# Patient Record
Sex: Female | Born: 1980 | Race: White | Hispanic: No | Marital: Married | Smoking: Never smoker
Health system: Southern US, Community
[De-identification: ages and names within clinical notes are randomized; demographics above are authoritative.]

## PROBLEM LIST (undated history)

## (undated) ENCOUNTER — Inpatient Hospital Stay (HOSPITAL_COMMUNITY): Payer: Self-pay

## (undated) DIAGNOSIS — Z789 Other specified health status: Secondary | ICD-10-CM

## (undated) HISTORY — PX: NO PAST SURGERIES: SHX2092

---
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
## 2000-10-11 ENCOUNTER — Other Ambulatory Visit: Admission: RE | Admit: 2000-10-11 | Discharge: 2000-10-11 | Payer: Self-pay | Admitting: Obstetrics and Gynecology

## 2000-11-08 ENCOUNTER — Other Ambulatory Visit: Admission: RE | Admit: 2000-11-08 | Discharge: 2000-11-08 | Payer: Self-pay | Admitting: Obstetrics and Gynecology

## 2000-11-08 ENCOUNTER — Encounter (INDEPENDENT_AMBULATORY_CARE_PROVIDER_SITE_OTHER): Payer: Self-pay | Admitting: Specialist

## 2001-04-20 ENCOUNTER — Other Ambulatory Visit: Admission: RE | Admit: 2001-04-20 | Discharge: 2001-04-20 | Payer: Self-pay | Admitting: Obstetrics and Gynecology

## 2001-11-13 ENCOUNTER — Other Ambulatory Visit: Admission: RE | Admit: 2001-11-13 | Discharge: 2001-11-13 | Payer: Self-pay | Admitting: Obstetrics and Gynecology

## 2002-04-29 ENCOUNTER — Other Ambulatory Visit: Admission: RE | Admit: 2002-04-29 | Discharge: 2002-04-29 | Payer: Self-pay | Admitting: Obstetrics and Gynecology

## 2002-12-20 ENCOUNTER — Other Ambulatory Visit: Admission: RE | Admit: 2002-12-20 | Discharge: 2002-12-20 | Payer: Self-pay | Admitting: Obstetrics and Gynecology

## 2003-06-25 ENCOUNTER — Other Ambulatory Visit: Admission: RE | Admit: 2003-06-25 | Discharge: 2003-06-25 | Payer: Self-pay | Admitting: Obstetrics and Gynecology

## 2003-12-25 ENCOUNTER — Other Ambulatory Visit: Admission: RE | Admit: 2003-12-25 | Discharge: 2003-12-25 | Payer: Self-pay | Admitting: Obstetrics and Gynecology

## 2004-06-07 ENCOUNTER — Other Ambulatory Visit: Admission: RE | Admit: 2004-06-07 | Discharge: 2004-06-07 | Payer: Self-pay | Admitting: Obstetrics and Gynecology

## 2004-12-27 ENCOUNTER — Other Ambulatory Visit: Admission: RE | Admit: 2004-12-27 | Discharge: 2004-12-27 | Payer: Self-pay | Admitting: Obstetrics and Gynecology

## 2005-12-14 ENCOUNTER — Ambulatory Visit: Payer: Self-pay | Admitting: Pulmonary Disease

## 2005-12-26 ENCOUNTER — Ambulatory Visit: Payer: Self-pay

## 2005-12-26 ENCOUNTER — Encounter: Payer: Self-pay | Admitting: Cardiology

## 2007-09-28 ENCOUNTER — Telehealth (INDEPENDENT_AMBULATORY_CARE_PROVIDER_SITE_OTHER): Payer: Self-pay | Admitting: *Deleted

## 2009-04-28 ENCOUNTER — Telehealth: Payer: Self-pay | Admitting: Pulmonary Disease

## 2014-04-29 LAB — OB RESULTS CONSOLE GC/CHLAMYDIA
Chlamydia: NEGATIVE
Gonorrhea: NEGATIVE

## 2014-05-02 NOTE — L&D Delivery Note (Signed)
Delivery Note At 3:31 AM a viable and healthy female was delivered via Vaginal, Spontaneous Delivery (Presentation: Middle Occiput Anterior).  APGAR: 9, 9; weight  pending.   Placenta status: Intact, Spontaneous.  Cord: 3 vessels   Anesthesia: None, local for repair Episiotomy: None Lacerations: 2nd degree Suture Repair: 3.0 vicryl Est. Blood Loss (mL):  150 cc  Mom to postpartum.  Baby to Couplet care / Skin to Skin.  Heidie Krall H. 11/28/2014, 3:56 AM

## 2014-05-28 LAB — OB RESULTS CONSOLE HEPATITIS B SURFACE ANTIGEN: Hepatitis B Surface Ag: NEGATIVE

## 2014-05-28 LAB — OB RESULTS CONSOLE RPR: RPR: NONREACTIVE

## 2014-05-28 LAB — OB RESULTS CONSOLE HIV ANTIBODY (ROUTINE TESTING): HIV: NONREACTIVE

## 2014-05-28 LAB — OB RESULTS CONSOLE RUBELLA ANTIBODY, IGM: RUBELLA: UNDETERMINED

## 2014-05-30 LAB — OB RESULTS CONSOLE ANTIBODY SCREEN: Antibody Screen: NEGATIVE

## 2014-05-30 LAB — OB RESULTS CONSOLE ABO/RH: RH Type: POSITIVE

## 2014-07-16 ENCOUNTER — Encounter (HOSPITAL_COMMUNITY): Payer: Self-pay | Admitting: *Deleted

## 2014-07-16 ENCOUNTER — Inpatient Hospital Stay (HOSPITAL_COMMUNITY)
Admission: AD | Admit: 2014-07-16 | Discharge: 2014-07-16 | Disposition: A | Discharge status: Home / Self Care | Payer: BLUE CROSS/BLUE SHIELD | Source: Ambulatory Visit | Attending: Obstetrics and Gynecology | Admitting: Obstetrics and Gynecology

## 2014-07-16 DIAGNOSIS — Z3A20 20 weeks gestation of pregnancy: Secondary | ICD-10-CM | POA: Diagnosis not present

## 2014-07-16 DIAGNOSIS — R51 Headache: Secondary | ICD-10-CM

## 2014-07-16 DIAGNOSIS — R030 Elevated blood-pressure reading, without diagnosis of hypertension: Secondary | ICD-10-CM | POA: Insufficient documentation

## 2014-07-16 DIAGNOSIS — O26892 Other specified pregnancy related conditions, second trimester: Secondary | ICD-10-CM | POA: Diagnosis not present

## 2014-07-16 DIAGNOSIS — O9989 Other specified diseases and conditions complicating pregnancy, childbirth and the puerperium: Secondary | ICD-10-CM | POA: Diagnosis not present

## 2014-07-16 DIAGNOSIS — R519 Headache, unspecified: Secondary | ICD-10-CM

## 2014-07-16 HISTORY — DX: Other specified health status: Z78.9

## 2014-07-16 LAB — URINALYSIS, ROUTINE W REFLEX MICROSCOPIC
Bilirubin Urine: NEGATIVE
GLUCOSE, UA: NEGATIVE mg/dL
HGB URINE DIPSTICK: NEGATIVE
Ketones, ur: NEGATIVE mg/dL
LEUKOCYTES UA: NEGATIVE
Nitrite: NEGATIVE
Protein, ur: NEGATIVE mg/dL
SPECIFIC GRAVITY, URINE: 1.01 (ref 1.005–1.030)
UROBILINOGEN UA: 0.2 mg/dL (ref 0.0–1.0)
pH: 6.5 (ref 5.0–8.0)

## 2014-07-16 NOTE — MAU Note (Signed)
Pt presents to MAU with complaints of feeling tingling in her hands, arms, and feet and feeling hot and flushed. States she checked her blood pressure at [facility] and it was elevated. Denies any vaginal bleeding.

## 2014-07-16 NOTE — MAU Provider Note (Signed)
History     CSN: [id number]  Arrival date and time: 07/16/14 1635   First Provider Initiated Contact with Patient 07/16/14 1722      Chief Complaint  Patient presents with  . Hot Flashes  . Hypertension   HPI    [name] G1P0 at [redacted]w[redacted]d who presents elevated BP readings; she checked her BP at [facility] and it was 129/85; She called her Dr Isidore Moos and they told her to come in. She does not have a history of hypertension.  She has been feeling her baby move. She denies leaking of fluid or vaginal bleeding    She has gained 5 lbs in the last week and is concerned about this .  She has noticed increased swelling in her legs and feet with tingling in her hands and feet.  OB History    Gravida Para Term Preterm AB TAB SAB Ectopic Multiple Living   1               Past Medical History  Diagnosis Date  . Medical history non-contributory     Past Surgical History  Procedure Laterality Date  . No past surgeries      History reviewed. No pertinent family history.  History  Substance Use Topics  . Smoking status: Never Smoker   . Smokeless tobacco: Not on file  . Alcohol Use: No    Allergies:  Allergies  Allergen Reactions  . Latex Swelling    Prescriptions prior to admission  Medication Sig Dispense Refill Last Dose  . Prenatal Vit-Fe Fumarate-FA (PRENATAL MULTIVITAMIN) TABS tablet Take 1 tablet by mouth daily at 12 noon.   07/15/2014 at Unknown time  . ranitidine (ZANTAC) 75 MG tablet Take 75 mg by mouth daily as needed for heartburn.   Past Week at Unknown time   Results for orders placed or performed during the hospital encounter of 07/16/14 (from the past 48 hour(s))  Urinalysis, Routine w reflex microscopic     Status: None   Collection Time: 07/16/14  4:49 PM  Result Value Ref Range   Color, Urine YELLOW YELLOW   APPearance CLEAR CLEAR   Specific Gravity, Urine 1.010 1.005 - 1.030   pH 6.5 5.0 - 8.0   Glucose, UA NEGATIVE NEGATIVE mg/dL   Hgb urine  dipstick NEGATIVE NEGATIVE   Bilirubin Urine NEGATIVE NEGATIVE   Ketones, ur NEGATIVE NEGATIVE mg/dL   Protein, ur NEGATIVE NEGATIVE mg/dL   Urobilinogen, UA 0.2 0.0 - 1.0 mg/dL   Nitrite NEGATIVE NEGATIVE   Leukocytes, UA NEGATIVE NEGATIVE    Comment: MICROSCOPIC NOT DONE ON URINES WITH NEGATIVE PROTEIN, BLOOD, LEUKOCYTES, NITRITE, OR GLUCOSE <1000 mg/dL.    Review of Systems  Constitutional: Negative for fever and chills.  Eyes: Negative for blurred vision.  Cardiovascular: Positive for leg swelling. Negative for chest pain.  Gastrointestinal: Negative for abdominal pain.  Neurological: Negative for dizziness and headaches.   Physical Exam   Blood pressure 122/69, pulse 82, temperature 98.2 F (36.8 C), resp. rate 16, height  (1.702 m), weight 73.936 kg (163 lb), last menstrual period 02/26/2014.  Physical Exam  Constitutional: She is oriented to person, place, and time. Vital signs are normal. She appears well-developed and well-nourished.  Non-toxic appearance. She does not have a sickly appearance. She does not appear ill. No distress.  HENT:  Head: Normocephalic.  Eyes: Pupils are equal, round, and reactive to light.  Neck: Neck supple.  Cardiovascular: Normal rate and normal heart sounds.  Respiratory: Effort normal and breath sounds normal. No respiratory distress.  GI: Soft. She exhibits no distension. There is no tenderness.  Musculoskeletal: Normal range of motion.       Right ankle: She exhibits swelling (non-pitting ).       Left ankle: She exhibits swelling (non-pitting ).  Neurological: She is alert and oriented to person, place, and time. She has normal reflexes.  Skin: Skin is warm. She is not diaphoretic.  Psychiatric: Her behavior is normal.    MAU Course  Procedures  None  MDM:    UA sent  BP: 138/85 BP: 128/84  1730: discussed patient with Dr. Claiborne Billingsallahan; awaiting urine results.  Protein negative   Assessment and Plan   A:  1. Headache  in pregnancy, second trimester     P:  Discharge home in stable condition Follow up in the office on Friday for a BP check Return to MAU if symptoms worsen Increase activity at work Support hose recommended Preeclampsia precautions     Duane LopeJennifer I Rasch, NP  07/16/2014 5:33 PM

## 2014-10-30 LAB — OB RESULTS CONSOLE GBS: STREP GROUP B AG: POSITIVE

## 2014-11-05 ENCOUNTER — Inpatient Hospital Stay (HOSPITAL_COMMUNITY)
Admission: AD | Admit: 2014-11-05 | Discharge: 2014-11-05 | Disposition: A | Discharge status: Home / Self Care | Payer: BLUE CROSS/BLUE SHIELD | Source: Ambulatory Visit | Attending: Obstetrics | Admitting: Obstetrics

## 2014-11-05 ENCOUNTER — Encounter (HOSPITAL_COMMUNITY): Payer: Self-pay

## 2014-11-05 ENCOUNTER — Inpatient Hospital Stay (HOSPITAL_COMMUNITY): Payer: BLUE CROSS/BLUE SHIELD

## 2014-11-05 DIAGNOSIS — Z3A36 36 weeks gestation of pregnancy: Secondary | ICD-10-CM | POA: Insufficient documentation

## 2014-11-05 DIAGNOSIS — O368130 Decreased fetal movements, third trimester, not applicable or unspecified: Secondary | ICD-10-CM | POA: Diagnosis not present

## 2014-11-05 DIAGNOSIS — O288 Other abnormal findings on antenatal screening of mother: Secondary | ICD-10-CM | POA: Insufficient documentation

## 2014-11-05 NOTE — MAU Provider Note (Signed)
  History     CSN: [id number]  Arrival date and time: 11/05/14 1251   First Provider Initiated Contact with Patient 11/05/14 1439      Chief Complaint  Patient presents with  . Decreased Fetal Movement   HPIpt is 2865w0d G1P0 sent from office with nonreactive NST and decreased FM Pt is having some contractions- Pt denies vaginal bleeding or LOF  Rn note:   MAU Note 11/05/2014 1:13 PM    Expand All Collapse All   Sent from office, non-reactive NST at office. Sent over for US (their tech was not in office) and monitoring         Past Medical History  Diagnosis Date  . Medical history non-contributory     Past Surgical History  Procedure Laterality Date  . No past surgeries      History reviewed. No pertinent family history.  History  Substance Use Topics  . Smoking status: Never Smoker   . Smokeless tobacco: Not on file  . Alcohol Use: No    Allergies:  Allergies  Allergen Reactions  . Latex Swelling    Prescriptions prior to admission  Medication Sig Dispense Refill Last Dose  . Prenatal Vit-Fe Fumarate-FA (PRENATAL MULTIVITAMIN) TABS tablet Take 1 tablet by mouth daily at 12 noon.   11/04/2014 at Unknown time  . ranitidine (ZANTAC) 75 MG tablet Take 75 mg by mouth daily as needed for heartburn.   11/05/2014 at Unknown time    Review of Systems  Constitutional: Negative for fever and chills.  Gastrointestinal: Positive for abdominal pain. Negative for nausea and vomiting.  Genitourinary: Negative for dysuria and urgency.   Physical Exam   Blood pressure 132/81, pulse 97, temperature 98.4 F (36.9 C), temperature source Oral, resp. rate 18, height 5\' 6"  (1.676 m), weight 179 lb (81.194 kg), last menstrual period 02/26/2014.  Physical Exam  Nursing note and vitals reviewed. Constitutional: She is oriented to person, place, and time. She appears well-developed and well-nourished. No distress.  HENT:  Head: Normocephalic.  Eyes: Pupils are equal, round, and  reactive to light.  Neck: Normal range of motion. Neck supple.  Cardiovascular: Normal rate.   Respiratory: Effort normal.  GI: Soft. She exhibits no distension. There is no tenderness. There is no rebound and no guarding.  Ctx every 2 to 3 min- mild; NST reactive baseline 125bpm  Musculoskeletal: Normal range of motion.  Neurological: She is alert and oriented to person, place, and time.  Skin: Skin is warm and dry.  Psychiatric: She has a normal mood and affect.    MAU Course  Procedures [place] Fetal Bpp W/o Non Stress  11/05/2014   OBSTETRICAL ULTRASOUND: This exam was performed within a [facility]. The OB US report was generated in the [facility] system, and faxed to the ordering physician.   This report is available in the [facility] [employer] PACS. See the [facility] Obstetric US report via the Image Link.  BPP 6/8 (-2 for no BM) Discussed with Dr. Chestine Sporelark- will have pt f/u in office tomorrow for repeat BPP Assessment and Plan  Decreased FM - kick counts form given F/u in office tomorrow  Christina Tran 11/05/2014, 2:43 PM

## 2014-11-05 NOTE — MAU Note (Signed)
Per Dr Chestine Sporelark pt to have BPP, if NST is reactive before BPP, the BPP is not to be complete.

## 2014-11-05 NOTE — MAU Note (Signed)
Sent from office, non-reactive NST at office.  Sent over for US (their tech was not in office) and monitoring

## 2014-11-27 ENCOUNTER — Encounter (HOSPITAL_COMMUNITY): Payer: Self-pay

## 2014-11-27 ENCOUNTER — Inpatient Hospital Stay (HOSPITAL_COMMUNITY)
Admission: AD | Admit: 2014-11-27 | Discharge: 2014-11-29 | DRG: 775 | Disposition: A | Discharge status: Home / Self Care | Payer: BLUE CROSS/BLUE SHIELD | Source: Ambulatory Visit | Attending: Obstetrics and Gynecology | Admitting: Obstetrics and Gynecology

## 2014-11-27 DIAGNOSIS — O99824 Streptococcus B carrier state complicating childbirth: Secondary | ICD-10-CM | POA: Diagnosis present

## 2014-11-27 DIAGNOSIS — Z3A39 39 weeks gestation of pregnancy: Secondary | ICD-10-CM | POA: Diagnosis present

## 2014-11-27 DIAGNOSIS — O4290 Premature rupture of membranes, unspecified as to length of time between rupture and onset of labor, unspecified weeks of gestation: Secondary | ICD-10-CM | POA: Diagnosis present

## 2014-11-27 LAB — CBC
HCT: 39.4 % (ref 36.0–46.0)
HEMOGLOBIN: 13.8 g/dL (ref 12.0–15.0)
MCH: 31.2 pg (ref 26.0–34.0)
MCHC: 35 g/dL (ref 30.0–36.0)
MCV: 88.9 fL (ref 78.0–100.0)
PLATELETS: 174 10*3/uL (ref 150–400)
RBC: 4.43 MIL/uL (ref 3.87–5.11)
RDW: 12.6 % (ref 11.5–15.5)
WBC: 17.5 10*3/uL — ABNORMAL HIGH (ref 4.0–10.5)

## 2014-11-27 LAB — TYPE AND SCREEN
ABO/RH(D): O POS
Antibody Screen: NEGATIVE

## 2014-11-27 LAB — POCT FERN TEST: POCT FERN TEST: POSITIVE

## 2014-11-27 MED ORDER — OXYTOCIN 40 UNITS IN LACTATED RINGERS INFUSION - SIMPLE MED
62.5000 mL/h | INTRAVENOUS | Status: DC
  Filled 2014-11-27: qty 1000

## 2014-11-27 MED ORDER — ZOLPIDEM TARTRATE 5 MG PO TABS: 5.0000 mg | ORAL_TABLET | Freq: Every evening | ORAL | Status: DC | PRN

## 2014-11-27 MED ORDER — LACTATED RINGERS IV SOLN
500.0000 mL | INTRAVENOUS | Status: DC | PRN
  Administered 2014-11-27 – 2014-11-28 (×2): 500 mL via INTRAVENOUS

## 2014-11-27 MED ORDER — CITRIC ACID-SODIUM CITRATE 334-500 MG/5ML PO SOLN
30.0000 mL | ORAL | Status: DC | PRN
  Administered 2014-11-27: 30 mL via ORAL
  Filled 2014-11-27: qty 15

## 2014-11-27 MED ORDER — OXYTOCIN BOLUS FROM INFUSION: 500.0000 mL | INTRAVENOUS | Status: DC

## 2014-11-27 MED ORDER — ONDANSETRON HCL 4 MG/2ML IJ SOLN
4.0000 mg | Freq: Four times a day (QID) | INTRAMUSCULAR | Status: DC | PRN
  Administered 2014-11-28: 4 mg via INTRAVENOUS
  Filled 2014-11-27: qty 2

## 2014-11-27 MED ORDER — PENICILLIN G POTASSIUM 5000000 UNITS IJ SOLR
5.0000 10*6.[IU] | Freq: Once | INTRAVENOUS | Status: AC
  Administered 2014-11-27: 5 10*6.[IU] via INTRAVENOUS
  Filled 2014-11-27: qty 5

## 2014-11-27 MED ORDER — LACTATED RINGERS IV SOLN: INTRAVENOUS | Status: DC

## 2014-11-27 MED ORDER — ACETAMINOPHEN 325 MG PO TABS: 650.0000 mg | ORAL_TABLET | ORAL | Status: DC | PRN

## 2014-11-27 MED ORDER — LIDOCAINE HCL (PF) 1 % IJ SOLN
30.0000 mL | INTRAMUSCULAR | Status: DC | PRN
  Administered 2014-11-28: 30 mL via SUBCUTANEOUS
  Filled 2014-11-27: qty 30

## 2014-11-27 MED ORDER — OXYCODONE-ACETAMINOPHEN 5-325 MG PO TABS: 2.0000 | ORAL_TABLET | ORAL | Status: DC | PRN

## 2014-11-27 MED ORDER — OXYCODONE-ACETAMINOPHEN 5-325 MG PO TABS: 1.0000 | ORAL_TABLET | ORAL | Status: DC | PRN

## 2014-11-27 MED ORDER — BUTORPHANOL TARTRATE 1 MG/ML IJ SOLN: 1.0000 mg | INTRAMUSCULAR | Status: DC | PRN

## 2014-11-27 MED ORDER — PENICILLIN G POTASSIUM 5000000 UNITS IJ SOLR
2.5000 10*6.[IU] | INTRAVENOUS | Status: DC
  Administered 2014-11-27 – 2014-11-28 (×2): 2.5 10*6.[IU] via INTRAVENOUS
  Filled 2014-11-27 (×6): qty 2.5

## 2014-11-27 MED ORDER — TERBUTALINE SULFATE 1 MG/ML IJ SOLN: 0.2500 mg | Freq: Once | INTRAMUSCULAR | Status: AC | PRN

## 2014-11-27 NOTE — Progress Notes (Signed)
Patient ID: Christina Tran, female   DOB: 09-07-1980, 34 y.o.   MRN: [id number]   S: Pt feeling sensation of needing to push with last 2 contractions O:  Filed Vitals:   11/27/14 1752 11/27/14 1819 11/27/14 1852 11/27/14 1920  BP: 119/71     Pulse: 86     Temp:      TempSrc:      Resp: Height:      Weight:       Uncomfortable Cvx 8/c/0 toco not monitored FHR no 130, pt previously doing intermittant monitoring. Now in active phase labor  A/P 1) Cont expectant management 2) Overall FWB reassuring 3) Will monitor continuously for now

## 2014-11-27 NOTE — Progress Notes (Signed)
Dr Tenny Craw updated on UC pattern, FHR, pt desire to have intermittent monitoring, lite laboring diet, and for doula to use TENS unit for pain control.  Dr Tenny Craw gave orders for intermittent monitoring, lite laboring diet, and okay for doula to use TENS unit.

## 2014-11-27 NOTE — MAU Note (Signed)
srom at 0700, reg ctx's q7 at 1100, then q 4, now back to back.  Getting closer and stronger. No bleeding.   Fluid is pinkish.

## 2014-11-27 NOTE — Progress Notes (Signed)
Notified of pt arrival in MAU, positive fern and vaginal exam. Will put in admit orders

## 2014-11-27 NOTE — H&P (Signed)
Christina Tran is a 34 y.o. female presenting for leaking fluid  34 yo G1P0 @ 39+1 presents c/o leaking fluid since 7 am. She presents c/o painful contractions. On presentation sh is 1/c/-2 station  Pt desires natural childbirth and is being assisted by a doula  History OB History    Gravida Para Term Preterm AB TAB SAB Ectopic Multiple Living   1              Past Medical History  Diagnosis Date  . Medical history non-contributory    Past Surgical History  Procedure Laterality Date  . No past surgeries     Family History: family history is not on file. Social History:  reports that she has never smoked. She does not have any smokeless tobacco history on file. She reports that she does not drink alcohol or use illicit drugs.   Prenatal Transfer Tran  Maternal Diabetes: No Genetic Screening: Declined Maternal Ultrasounds/Referrals: Normal Fetal Ultrasounds or other Referrals:  None Maternal Substance Abuse:  No Significant Maternal Medications:  None Significant Maternal Lab Results:  none  Other Comments:  none  ROS  Dilation: 1 Effacement (%): 100 Station: -2, -1 Exam by:: Christina Spurlock Frizzel RN Blood pressure 119/71, pulse 86, temperature 97.5 F (36.4 C), temperature source Oral, resp. rate 20, height  (1.727 m), weight 83.008 kg (183 lb), last menstrual period 02/26/2014. Exam Physical Exam  Prenatal labs: ABO, Rh: --/--/O POS (07/28 1455) Antibody: NEG (07/28 1455) Rubella: Equivocal (01/27 0000) RPR: Nonreactive (01/27 0000)  HBsAg: Negative (01/27 0000)  HIV: Non-reactive (01/27 0000)  GBS: Positive (06/30 0000)   Assessment/Plan: 1) Admit 2) PCN for GBS 3) Epidural if desires 4) Will attempt pitocin augmentation if no forward progress   Christina Tran H. 11/27/2014, 8:13 PM

## 2014-11-28 ENCOUNTER — Encounter (HOSPITAL_COMMUNITY): Payer: Self-pay | Admitting: *Deleted

## 2014-11-28 LAB — RPR: RPR Ser Ql: NONREACTIVE

## 2014-11-28 LAB — ABO/RH: ABO/RH(D): O POS

## 2014-11-28 MED ORDER — METHYLERGONOVINE MALEATE 0.2 MG PO TABS: 0.2000 mg | ORAL_TABLET | ORAL | Status: DC | PRN

## 2014-11-28 MED ORDER — IBUPROFEN 600 MG PO TABS
600.0000 mg | ORAL_TABLET | Freq: Four times a day (QID) | ORAL | Status: DC
  Administered 2014-11-28 – 2014-11-29 (×6): 600 mg via ORAL
  Filled 2014-11-28 (×6): qty 1

## 2014-11-28 MED ORDER — DIPHENHYDRAMINE HCL 25 MG PO CAPS: 25.0000 mg | ORAL_CAPSULE | Freq: Four times a day (QID) | ORAL | Status: DC | PRN

## 2014-11-28 MED ORDER — METHYLERGONOVINE MALEATE 0.2 MG/ML IJ SOLN: 0.2000 mg | INTRAMUSCULAR | Status: DC | PRN

## 2014-11-28 MED ORDER — ZOLPIDEM TARTRATE 5 MG PO TABS: 5.0000 mg | ORAL_TABLET | Freq: Every evening | ORAL | Status: DC | PRN

## 2014-11-28 MED ORDER — TERBUTALINE SULFATE 1 MG/ML IJ SOLN: 0.2500 mg | Freq: Once | INTRAMUSCULAR | Status: DC | PRN

## 2014-11-28 MED ORDER — SENNOSIDES-DOCUSATE SODIUM 8.6-50 MG PO TABS
2.0000 | ORAL_TABLET | ORAL | Status: DC
Start: 2014-11-29 — End: 2014-11-29
  Administered 2014-11-28: 2 via ORAL
  Filled 2014-11-28: qty 2

## 2014-11-28 MED ORDER — OXYTOCIN 40 UNITS IN LACTATED RINGERS INFUSION - SIMPLE MED
1.0000 m[IU]/min | INTRAVENOUS | Status: DC
  Administered 2014-11-28: 2 m[IU]/min via INTRAVENOUS

## 2014-11-28 MED ORDER — ACETAMINOPHEN 325 MG PO TABS: 650.0000 mg | ORAL_TABLET | ORAL | Status: DC | PRN

## 2014-11-28 MED ORDER — OXYCODONE-ACETAMINOPHEN 5-325 MG PO TABS: 2.0000 | ORAL_TABLET | ORAL | Status: DC | PRN

## 2014-11-28 MED ORDER — HYDROCORTISONE ACE-PRAMOXINE 1-1 % RE FOAM
1.0000 | Freq: Two times a day (BID) | RECTAL | Status: DC
  Administered 2014-11-28 (×2): 1 via RECTAL
  Filled 2014-11-28 (×2): qty 10

## 2014-11-28 MED ORDER — ONDANSETRON HCL 4 MG/2ML IJ SOLN: 4.0000 mg | INTRAMUSCULAR | Status: DC | PRN

## 2014-11-28 MED ORDER — OXYCODONE-ACETAMINOPHEN 5-325 MG PO TABS: 1.0000 | ORAL_TABLET | ORAL | Status: DC | PRN

## 2014-11-28 MED ORDER — LANOLIN HYDROUS EX OINT: TOPICAL_OINTMENT | CUTANEOUS | Status: DC | PRN

## 2014-11-28 MED ORDER — PRENATAL MULTIVITAMIN CH
1.0000 | ORAL_TABLET | Freq: Every day | ORAL | Status: DC
  Administered 2014-11-29: 1 via ORAL
  Filled 2014-11-28 (×2): qty 1

## 2014-11-28 MED ORDER — TETANUS-DIPHTH-ACELL PERTUSSIS 5-2.5-18.5 LF-MCG/0.5 IM SUSP
0.5000 mL | Freq: Once | INTRAMUSCULAR | Status: DC
Start: 2014-11-29 — End: 2014-11-29

## 2014-11-28 MED ORDER — DIBUCAINE 1 % RE OINT
1.0000 "application " | TOPICAL_OINTMENT | RECTAL | Status: DC | PRN
  Filled 2014-11-28: qty 28

## 2014-11-28 MED ORDER — ONDANSETRON HCL 4 MG PO TABS: 4.0000 mg | ORAL_TABLET | ORAL | Status: DC | PRN

## 2014-11-28 MED ORDER — WITCH HAZEL-GLYCERIN EX PADS: 1.0000 "application " | MEDICATED_PAD | CUTANEOUS | Status: DC | PRN

## 2014-11-28 MED ORDER — BENZOCAINE-MENTHOL 20-0.5 % EX AERO
1.0000 "application " | INHALATION_SPRAY | CUTANEOUS | Status: DC | PRN
  Administered 2014-11-28: 1 via TOPICAL
  Filled 2014-11-28: qty 56

## 2014-11-28 MED ORDER — SIMETHICONE 80 MG PO CHEW: 80.0000 mg | CHEWABLE_TABLET | ORAL | Status: DC | PRN

## 2014-11-28 NOTE — Lactation Note (Signed)
This note was copied from the chart of Christina Tran. Lactation Consultation Note  Patient Name: Christina Tran [id number] Date: 11/28/2014 Reason for consult: Initial assessment Mom reports some nipple tenderness with nursing. Assisted Mom with positioning, demonstrated how to adjust bottom lip down for more comfort. Basic teaching reviewed with Mom. Care for sore nipples reviewed, no breakdown observed. Comfort gels given with instructions. Lactation brochure left for review, advised of OP services and support group. Encouraged to call for assist as desired.   Maternal Data Has patient been taught Hand Expression?: Yes Does the patient have breastfeeding experience prior to this delivery?: No  Feeding Feeding Type: Breast Fed Length of feed: 10 min  LATCH Score/Interventions Latch: Grasps breast easily, tongue down, lips flanged, rhythmical sucking. Intervention(s): Adjust position;Assist with latch;Breast massage;Breast compression  Audible Swallowing: A few with stimulation  Type of Nipple: Everted at rest and after stimulation  Comfort (Breast/Nipple): Filling, red/small blisters or bruises, mild/mod discomfort  Problem noted: Mild/Moderate discomfort Interventions (Mild/moderate discomfort): Comfort gels;Hand massage;Hand expression  Hold (Positioning): Assistance needed to correctly position infant at breast and maintain latch. Intervention(s): Breastfeeding basics reviewed;Support Pillows;Position options;Skin to skin  LATCH Score: 7  Lactation Tools Discussed/Used Tools: Comfort gels   Consult Status Consult Status: Follow-up Date: 11/29/14 Follow-up type: In-patient    Alfred Levins 11/28/2014, 10:14 PM

## 2014-11-29 NOTE — Plan of Care (Signed)
Problem: Discharge Progression Outcomes Goal: MMR given as ordered Outcome: Not Applicable Date Met:  02/89/02 Pt was equivocal for rubella and educated,but declined the shot

## 2014-11-29 NOTE — Progress Notes (Signed)
PPD#1 Pt without complaints Would like to go home. IMP/ stable Plan/Will discharge to home.

## 2014-11-29 NOTE — Discharge Summary (Signed)
Obstetric Discharge Summary Reason for Admission: onset of labor and rupture of membranes Prenatal Procedures: ultrasound Intrapartum Procedures: spontaneous vaginal delivery Postpartum Procedures: none Complications-Operative and Postpartum: 2nd degree lac HEMOGLOBIN  Date Value Ref Range Status  11/27/2014 13.8 12.0 - 15.0 g/dL Final   HCT  Date Value Ref Range Status  11/27/2014 39.4 36.0 - 46.0 % Final    Physical Exam:  General: alert Lochia: appropriate Uterine Fundus: firm   Discharge Diagnoses: Term Pregnancy-delivered  Discharge Information: Date: 11/29/2014 Activity: pelvic rest Diet: routine Medications: PNV and Ibuprofen Condition: stable Instructions: refer to practice specific booklet Discharge to: home Follow-up Information    Follow up with Almon Hercules., MD. Schedule an appointment as soon as possible for a visit in 1 month.   Specialty:  Obstetrics and Gynecology   Contact information:   [address] [place] [address] [phone]       Newborn Data: Live born female  Birth Weight: 7 lb 3.8 oz (3283 g) APGAR: 9, 9  Home with mother.  Abhi Moccia E 11/29/2014, 7:20 AM

## 2014-11-29 NOTE — Lactation Note (Signed)
This note was copied from the chart of Christina Uliana Brinker. Lactation Consultation Note: Mom has baby latched to breast when I went into room. Good latch observed and mom reports no pain. Few swallows noted. Mom reports baby fed since I was in last for 20 min, No questions at present. To call prn  Patient Name: Christina Tran [id number] Date: 11/29/2014 Reason for consult: Follow-up assessment   Maternal Data Formula Feeding for Exclusion: No Has patient been taught Hand Expression?: Yes Does the patient have breastfeeding experience prior to this delivery?: No  Feeding Feeding Type: Breast Fed Length of feed: 17 min  LATCH Score/Interventions Latch: Grasps breast easily, tongue down, lips flanged, rhythmical sucking.  Audible Swallowing: A few with stimulation  Type of Nipple: Everted at rest and after stimulation  Comfort (Breast/Nipple): Soft / non-tender     Hold (Positioning): No assistance needed to correctly position infant at breast. Intervention(s): Breastfeeding basics reviewed  LATCH Score: 9  Lactation Tools Discussed/Used     Consult Status Consult Status: Complete    Pamelia Hoit 11/29/2014, 12:32 PM

## 2014-11-29 NOTE — Lactation Note (Signed)
This note was copied from the chart of Christina Aujanae Mccullum. Lactation Consultation Note: Mom trying to breast fed when I went into room. Reports baby cluster fed through the night and is sleepy at present. Undressed but still too sleepy to latch. Reviewed feeding cues and to nurse whenever she sees them. No questions at present. Possible DC today. Reviewed BFSG and OP appointments as resources after DC. TO call prn  Patient Name: Christina Tran [id number] Date: 11/29/2014 Reason for consult: Follow-up assessment   Maternal Data Formula Feeding for Exclusion: No Has patient been taught Hand Expression?: Yes Does the patient have breastfeeding experience prior to this delivery?: No  Feeding Feeding Type: Breast Fed Length of feed: 9 min  LATCH Score/Interventions Latch: Too sleepy or reluctant, no latch achieved, no sucking elicited.  Audible Swallowing: None  Type of Nipple: Everted at rest and after stimulation  Comfort (Breast/Nipple): Soft / non-tender     Hold (Positioning): No assistance needed to correctly position infant at breast. Intervention(s): Breastfeeding basics reviewed  LATCH Score: 6  Lactation Tools Discussed/Used     Consult Status Consult Status: Complete    Pamelia Hoit 11/29/2014, 11:26 AM

## 2014-11-30 ENCOUNTER — Telehealth (HOSPITAL_COMMUNITY): Payer: Self-pay | Admitting: *Deleted

## 2015-02-13 ENCOUNTER — Other Ambulatory Visit: Payer: Self-pay | Admitting: Obstetrics and Gynecology

## 2015-02-13 ENCOUNTER — Ambulatory Visit
Admission: RE | Admit: 2015-02-13 | Discharge: 2015-02-13 | Disposition: A | Discharge status: Home / Self Care | Payer: BLUE CROSS/BLUE SHIELD | Source: Ambulatory Visit | Attending: Obstetrics and Gynecology | Admitting: Obstetrics and Gynecology

## 2015-02-13 DIAGNOSIS — N6320 Unspecified lump in the left breast, unspecified quadrant: Secondary | ICD-10-CM

## 2016-05-02 NOTE — L&D Delivery Note (Signed)
Delivery Note Patient pushed well with 2 contractions.  At 3:08 AM a viable female was delivered via Vaginal, Spontaneous Delivery (Presentation: OA).  APGAR: 8, 9; weight pending Placenta status: Spontaneous, in tact.  Cord: 3V with the following complications:  None .  Cord pH: n/a  Anesthesia:  Lidocaine Episiotomy:  None Lacerations:  2nd degree Suture Repair: 2.0 vicryl rapide Est. Blood Loss (mL):  300 mL  Mom to postpartum.  Baby to Couplet care / Skin to Skin.  Redell Bhandari GEFFEL Hildred Mollica 02/02/2017, 3:52 AM

## 2016-08-11 NOTE — Progress Notes (Signed)
Cardiology Office Note   Date:  08/13/2016   ID:  Christina Tran, DOB 06-Dec-1980, MRN [id number]  PCP:  No primary care provider on file.  Cardiologist:   Rollene Rotunda, MD  Referring:  Marlow Baars, MD  Chief Complaint  Patient presents with  . Palpitations      History of Present Illness: Christina Tran is a 36 y.o. female who is referred by Marlow Baars, MD for evaluation of palpitations.   She is [redacted] weeks pregnant. She had palpitations with her first child is now 79 months old but this was postpartum. These went away. She's not having palpitations routinely. She describes these as skipping beats followed by a pause. They happen daily. She has runs of them that she notices mostly at rest. She does not have associated syncope although she feels a little lightheaded. She feels like she has to take a deep breath. She can't bring them on. They seem to happen spontaneously and are not increased with exertion. She's not describing associated symptoms such as nausea vomiting or diarrhea is. She's had no PND or orthopnea.  Of note the patient did have migraines and I did see an echocardiogram result from 2007 that was normal. She thinks that were carotid Dopplers as well.   Past Medical History:  Diagnosis Date  . Medical history non-contributory   No past history.   Past Surgical History:  Procedure Laterality Date  . NO PAST SURGERIES       Current Outpatient Prescriptions  Medication Sig Dispense Refill  . Prenatal Vit-Fe Fumarate-FA (PRENATAL MULTIVITAMIN) TABS tablet Take 1 tablet by mouth daily at 12 noon.    . ranitidine (ZANTAC) 75 MG tablet Take 75 mg by mouth daily as needed for heartburn.     No current facility-administered medications for this visit.     Allergies:   Latex    Social History:  The patient  reports that she has never smoked. She has never used smokeless tobacco. She reports that she does not drink alcohol or use drugs.   Family History:   The patient's family history includes Breast cancer in her mother.    ROS:  Please see the history of present illness.   Otherwise, review of systems are positive for none.   All other systems are reviewed and negative.    PHYSICAL EXAM: VS:  BP 110/68   Pulse 95   Ht  (1.727 m)   Wt 158 lb (71.7 kg)   BMI 24.02 kg/m  , BMI Body mass index is 24.02 kg/m. GENERAL:  Well appearing HEENT:  Pupils equal round and reactive, fundi not visualized, oral mucosa unremarkable NECK:  No jugular venous distention, waveform within normal limits, carotid upstroke brisk and symmetric, no bruits, no thyromegaly LYMPHATICS:  No cervical, inguinal adenopathy LUNGS:  Clear to auscultation bilaterally BACK:  No CVA tenderness CHEST:  Unremarkable HEART:  PMI not displaced or sustained,S1 and S2 within normal limits, no S3, no S4, no clicks, no rubs, soft 2 out of 6 short apical systolic murmur, no diastolic murmurs ABD:  Flat, positive bowel sounds normal in frequency in pitch, no bruits, no rebound, no guarding, no midline pulsatile mass, no hepatomegaly, no splenomegaly EXT:  2 plus pulses throughout, no edema, no cyanosis no clubbing SKIN:  No rashes no nodules NEURO:  Cranial nerves II through XII grossly intact, motor grossly intact throughout PSYCH:  Cognitively intact, oriented to person place and time    EKG:  EKG is ordered today. The ekg ordered today demonstrates sinus rhythm, rate 95, axis slightly rightward, intervals within normal limits, borderline right atrial enlargement.   Recent Labs: No results found for requested labs within last 8760 hours.    Lipid Panel No results found for: CHOL, TRIG, HDL, CHOLHDL, VLDL, LDLCALC, LDLDIRECT    Wt Readings from Last 3 Encounters:  08/12/16 158 lb (71.7 kg)  11/27/14 183 lb (83 kg)  11/05/14 179 lb (81.2 kg)      Other studies Reviewed: Additional studies/ records that were reviewed today include: Echo 2007. Review of the  above records demonstrates:  Please see elsewhere in the note.     ASSESSMENT AND PLAN:  PALPITATIONS:  I suspect PACs. She will have a 24-hour Holter. She will get an echocardiogram to confirm a structurally normal heart. We discussed possible therapy that she would like to minimize this particularly since she is pregnant and doesn't want any prescriptions at this point. She did have a normal TSH and I was able to review this.  She will let me know if she has increasing palpitations.   Current medicines are reviewed at length with the patient today.  The patient does not have concerns regarding medicines.  The following changes have been made:  no change  Labs/ tests ordered today include:   Orders Placed This Encounter  Procedures  . Holter monitor - 24 hour  . EKG 12-Lead  . ECHOCARDIOGRAM COMPLETE     Disposition:   FU with me as needed.      Signed, Rollene Rotunda, MD  08/13/2016 3:49 PM    [facility]

## 2016-08-12 ENCOUNTER — Encounter: Payer: Self-pay | Admitting: Cardiology

## 2016-08-12 ENCOUNTER — Ambulatory Visit (INDEPENDENT_AMBULATORY_CARE_PROVIDER_SITE_OTHER): Payer: BLUE CROSS/BLUE SHIELD | Admitting: Cardiology

## 2016-08-12 VITALS — BP 110/68 | HR 95 | Ht 68.0 in | Wt 158.0 lb

## 2016-08-12 DIAGNOSIS — R002 Palpitations: Secondary | ICD-10-CM

## 2016-08-12 NOTE — Patient Instructions (Signed)
Medication Instructions:   NO CHANGE  Testing/Procedures:  Your physician has requested that you have an echocardiogram. Echocardiography is a painless test that uses sound waves to create images of your heart. It provides your doctor with information about the size and shape of your heart and how well your heart's chambers and valves are working. This procedure takes approximately one hour. There are no restrictions for this procedure.   Your physician has recommended that you wear a 24 HOUR holter monitor. Holter monitors are medical devices that record the heart's electrical activity. Doctors most often use these monitors to diagnose arrhythmias. Arrhythmias are problems with the speed or rhythm of the heartbeat. The monitor is a small, portable device. You can wear one while you do your normal daily activities. This is usually used to diagnose what is causing palpitations/syncope (passing out).    Follow-Up:  Your physician recommends that you schedule a follow-up appointment in: AS NEEDED PENDING TEST RESULTS

## 2016-08-13 ENCOUNTER — Encounter: Payer: Self-pay | Admitting: Cardiology

## 2016-08-13 DIAGNOSIS — R002 Palpitations: Secondary | ICD-10-CM | POA: Insufficient documentation

## 2016-08-25 ENCOUNTER — Ambulatory Visit (HOSPITAL_COMMUNITY): Payer: BLUE CROSS/BLUE SHIELD | Attending: Cardiovascular Disease

## 2016-08-25 ENCOUNTER — Ambulatory Visit (INDEPENDENT_AMBULATORY_CARE_PROVIDER_SITE_OTHER): Payer: BLUE CROSS/BLUE SHIELD

## 2016-08-25 ENCOUNTER — Other Ambulatory Visit: Payer: Self-pay

## 2016-08-25 DIAGNOSIS — I071 Rheumatic tricuspid insufficiency: Secondary | ICD-10-CM | POA: Diagnosis not present

## 2016-08-25 DIAGNOSIS — Z3A16 16 weeks gestation of pregnancy: Secondary | ICD-10-CM | POA: Diagnosis not present

## 2016-08-25 DIAGNOSIS — R002 Palpitations: Secondary | ICD-10-CM | POA: Insufficient documentation

## 2016-08-25 DIAGNOSIS — I340 Nonrheumatic mitral (valve) insufficiency: Secondary | ICD-10-CM | POA: Diagnosis not present

## 2016-08-25 DIAGNOSIS — O99412 Diseases of the circulatory system complicating pregnancy, second trimester: Secondary | ICD-10-CM | POA: Diagnosis not present

## 2017-01-30 ENCOUNTER — Encounter (HOSPITAL_COMMUNITY): Payer: Self-pay

## 2017-01-30 ENCOUNTER — Inpatient Hospital Stay (EMERGENCY_DEPARTMENT_HOSPITAL)
Admission: AD | Admit: 2017-01-30 | Discharge: 2017-01-30 | Disposition: A | Discharge status: Home / Self Care | Payer: Managed Care, Other (non HMO) | Source: Ambulatory Visit | Attending: Obstetrics & Gynecology | Admitting: Obstetrics & Gynecology

## 2017-01-30 DIAGNOSIS — Z3A39 39 weeks gestation of pregnancy: Secondary | ICD-10-CM

## 2017-01-30 DIAGNOSIS — O163 Unspecified maternal hypertension, third trimester: Secondary | ICD-10-CM

## 2017-01-30 DIAGNOSIS — O134 Gestational [pregnancy-induced] hypertension without significant proteinuria, complicating childbirth: Secondary | ICD-10-CM | POA: Diagnosis not present

## 2017-01-30 LAB — COMPREHENSIVE METABOLIC PANEL
ALBUMIN: 3.1 g/dL — AB (ref 3.5–5.0)
ALK PHOS: 90 U/L (ref 38–126)
ALT: 18 U/L (ref 14–54)
AST: 25 U/L (ref 15–41)
Anion gap: 7 (ref 5–15)
BUN: 8 mg/dL (ref 6–20)
CHLORIDE: 103 mmol/L (ref 101–111)
CO2: 23 mmol/L (ref 22–32)
Calcium: 8.8 mg/dL — ABNORMAL LOW (ref 8.9–10.3)
Creatinine, Ser: 0.57 mg/dL (ref 0.44–1.00)
GFR calc Af Amer: >60 mL/min (ref >60)
GFR calc non Af Amer: >60 mL/min (ref >60)
GLUCOSE: 115 mg/dL — AB (ref 65–99)
Potassium: 4.2 mmol/L (ref 3.5–5.1)
Sodium: 133 mmol/L — ABNORMAL LOW (ref 135–145)
Total Bilirubin: 0.5 mg/dL (ref 0.3–1.2)
Total Protein: 6.6 g/dL (ref 6.5–8.1)

## 2017-01-30 LAB — URINALYSIS, ROUTINE W REFLEX MICROSCOPIC
Bilirubin Urine: NEGATIVE
Glucose, UA: NEGATIVE mg/dL
Hgb urine dipstick: NEGATIVE
Ketones, ur: NEGATIVE mg/dL
Nitrite: NEGATIVE
PROTEIN: NEGATIVE mg/dL
Specific Gravity, Urine: 1.003 — ABNORMAL LOW (ref 1.005–1.030)
pH: 7 (ref 5.0–8.0)

## 2017-01-30 LAB — PROTEIN / CREATININE RATIO, URINE
Creatinine, Urine: 27 mg/dL
Total Protein, Urine: <6 mg/dL

## 2017-01-30 LAB — CBC
HEMATOCRIT: 40.9 % (ref 36.0–46.0)
HEMOGLOBIN: 13.9 g/dL (ref 12.0–15.0)
MCH: 30.5 pg (ref 26.0–34.0)
MCHC: 34 g/dL (ref 30.0–36.0)
MCV: 89.7 fL (ref 78.0–100.0)
Platelets: 170 10*3/uL (ref 150–400)
RBC: 4.56 MIL/uL (ref 3.87–5.11)
RDW: 12.8 % (ref 11.5–15.5)
WBC: 12.5 10*3/uL — ABNORMAL HIGH (ref 4.0–10.5)

## 2017-01-30 NOTE — MAU Note (Signed)
Pt has been having face and hand swelling today. Also felt dizzy and lightheaded Pt declined changing into gown and said she would decline a cervical exam.

## 2017-01-30 NOTE — Discharge Instructions (Signed)
Hypertension During Pregnancy °Hypertension is also called high blood pressure. High blood pressure means that the force of your blood moving in your body is too strong. When you are pregnant, this condition should be watched carefully. It can cause problems for you and your baby. °Follow these instructions at home: °Eating and drinking °· Drink enough fluid to keep your pee (urine) clear or pale yellow. °· Eat healthy foods that are low in salt (sodium). °? Do not add salt to your food. °? Check labels on foods and drinks to see much salt is in them. Look on the label where you see "Sodium." °Lifestyle °· Do not use any products that contain nicotine or tobacco, such as cigarettes and e-cigarettes. If you need help quitting, ask your doctor. °· Do not use alcohol. °· Avoid caffeine. °· Avoid stress. Rest and get plenty of sleep. °General instructions °· Take over-the-counter and prescription medicines only as told by your doctor. °· While lying down, lie on your left side. This keeps pressure off your baby. °· While sitting or lying down, raise (elevate) your feet. Try putting some pillows under your lower legs. °· Exercise regularly. Ask your doctor what kinds of exercise are best for you. °· Keep all prenatal and follow-up visits as told by your doctor. This is important. °Contact a doctor if: °· You have symptoms that your doctor told you to watch for, such as: °? Fever. °? Throwing up (vomiting). °? Headache. °Get help right away if: °· You have very bad pain in your belly (abdomen). °· You are throwing up, and this does not get better with treatment. °· You suddenly get swelling in your hands, ankles, or face. °· You gain 4 lb (1.8 kg) or more in 1 week. °· You get bleeding from your vagina. °· You have blood in your pee. °· You do not feel your baby moving as much as normal. °· You have a change in vision. °· You have muscle twitching or sudden tightening (spasms). °· You have trouble breathing. °· Your lips  or fingernails turn blue. °This information is not intended to replace advice given to you by your health care provider. Make sure you discuss any questions you have with your health care provider. °Document Released: 05/21/2010 Document Revised: 12/29/2015 Document Reviewed: 12/29/2015 °Elsevier Interactive Patient Education © 2017 [employer] Inc[employer] °Preeclampsia and Eclampsia °Preeclampsia is a serious condition that develops only during pregnancy. It is also called toxemia of pregnancy. This condition causes high blood pressure along with other symptoms, such as swelling and headaches. These symptoms may develop as the condition gets worse. Preeclampsia may occur at 20 weeks of pregnancy or later. °Diagnosing and treating preeclampsia early is very important. If not treated early, it can cause serious problems for you and your baby. One problem it can lead to is eclampsia, which is a condition that causes muscle jerking or shaking (convulsions or seizures) in the mother. Delivering your baby is the best treatment for preeclampsia or eclampsia. Preeclampsia and eclampsia symptoms usually go away after your baby is born. °What are the causes? °The cause of preeclampsia is not known. °What increases the risk? °The following risk factors make you more likely to develop preeclampsia: °· Being pregnant for the first time. °· Having had preeclampsia during a past pregnancy. °· Having a family history of preeclampsia. °· Having high blood pressure. °· Being pregnant with twins or triplets. °· Being 35 or older. °· Being African-American. °· Having kidney disease or diabetes. °·   Having medical conditions such as lupus or blood diseases. °· Being very overweight (obese). ° °What are the signs or symptoms? °The earliest signs of preeclampsia are: °· High blood pressure. °· Increased protein in your urine. Your health care provider will check for this at every visit before you give birth (prenatal visit). ° °Other symptoms that  may develop as the condition gets worse include: °· Severe headaches. °· Sudden weight gain. °· Swelling of the hands, face, legs, and feet. °· Nausea and vomiting. °· Vision problems, such as blurred or double vision. °· Numbness in the face, arms, legs, and feet. °· Urinating less than usual. °· Dizziness. °· Slurred speech. °· Abdominal pain, especially upper abdominal pain. °· Convulsions or seizures. ° °Symptoms generally go away after giving birth. °How is this diagnosed? °There are no screening tests for preeclampsia. Your health care provider will ask you about symptoms and check for signs of preeclampsia during your prenatal visits. You may also have tests that include: °· Urine tests. °· Blood tests. °· Checking your blood pressure. °· Monitoring your baby’s heart rate. °· Ultrasound. ° °How is this treated? °You and your health care provider will determine the treatment approach that is best for you. Treatment may include: °· Having more frequent prenatal exams to check for signs of preeclampsia, if you have an increased risk for preeclampsia. °· Bed rest. °· Reducing how much salt (sodium) you eat. °· Medicine to lower your blood pressure. °· Staying in the hospital, if your condition is severe. There, treatment will focus on controlling your blood pressure and the amount of fluids in your body (fluid retention). °· You may need to take medicine (magnesium sulfate) to prevent seizures. This medicine may be given as an injection or through an IV tube. °· Delivering your baby early, if your condition gets worse. You may have your labor started with medicine (induced), or you may have a cesarean delivery. ° °Follow these instructions at home: °Eating and drinking ° °· Drink enough fluid to keep your urine clear or pale yellow. °· Eat a healthy diet that is low in sodium. Do not add salt to your food. Check nutrition labels to see how much sodium a food or beverage contains. °· Avoid  caffeine. °Lifestyle °· Do not use any products that contain nicotine or tobacco, such as cigarettes and e-cigarettes. If you need help quitting, ask your health care provider. °· Do not use alcohol or drugs. °· Avoid stress as much as possible. Rest and get plenty of sleep. °General instructions °· Take over-the-counter and prescription medicines only as told by your health care provider. °· When lying down, lie on your side. This keeps pressure off of your baby. °· When sitting or lying down, raise (elevate) your feet. Try putting some pillows underneath your lower legs. °· Exercise regularly. Ask your health care provider what kinds of exercise are best for you. °· Keep all follow-up and prenatal visits as told by your health care provider. This is important. °How is this prevented? °To prevent preeclampsia or eclampsia from developing during another pregnancy: °· Get proper medical care during pregnancy. Your health care provider may be able to prevent preeclampsia or diagnose and treat it early. °· Your health care provider may have you take a low-dose aspirin or a calcium supplement during your next pregnancy. °· You may have tests of your blood pressure and kidney function after giving birth. °· Maintain a healthy weight. Ask your health care provider for   help managing weight gain during pregnancy. °· Work with your health care provider to manage any long-term (chronic) health conditions you have, such as diabetes or kidney problems. ° °Contact a health care provider if: °· You gain more weight than expected. °· You have headaches. °· You have nausea or vomiting. °· You have abdominal pain. °· You feel dizzy or light-headed. °Get help right away if: °· You develop sudden or severe swelling anywhere in your body. This usually happens in the legs. °· You gain 5 lbs (2.3 kg) or more during one week. °· You have severe: °? Abdominal pain. °? Headaches. °? Dizziness. °? Vision problems. °? Confusion. °? Nausea or  vomiting. °· You have a seizure. °· You have trouble moving any part of your body. °· You develop numbness in any part of your body. °· You have trouble speaking. °· You have any abnormal bleeding. °· You pass out. °This information is not intended to replace advice given to you by your health care provider. Make sure you discuss any questions you have with your health care provider. °Document Released: 04/15/2000 Document Revised: 12/15/2015 Document Reviewed: 11/23/2015 °Elsevier Interactive Patient Education © 2018 Elsevier Inc. ° °

## 2017-01-30 NOTE — MAU Provider Note (Signed)
History     CSN: [id number]  Arrival date and time: 01/30/17 4098   First Provider Initiated Contact with Patient 01/30/17 1814      Chief Complaint  Patient presents with  . Dizziness  . Facial Swelling   HPI  [name] is a 36 y.o. G2P1001 at [redacted]w[redacted]d who presents to MAU today with complaint of intermittent edema for 2-3 days in the hands, feet and face. She also had dizziness this afternoon that resolved. She states that swelling has improved with rest. She denies headache, blurred vision, floaters, upper abdominal pain, vaginal bleeding, LOF or regular contractions. She reports normal fetal movement. She denies complications with this or previous pregnancy.   OB History    Gravida Para Term Preterm AB Living   SAB TAB Ectopic Multiple Live Births   1     0 1      Past Medical History:  Diagnosis Date  . Medical history non-contributory     Past Surgical History:  Procedure Laterality Date  . NO PAST SURGERIES      Family History  Problem Relation Age of Onset  . Breast cancer Mother     Social History  Substance Use Topics  . Smoking status: Never Smoker  . Smokeless tobacco: Never Used  . Alcohol use No    Allergies:  Allergies  Allergen Reactions  . Latex Swelling    Prescriptions Prior to Admission  Medication Sig Dispense Refill Last Dose  . Prenatal Vit-Fe Fumarate-FA (PRENATAL MULTIVITAMIN) TABS tablet Take 1 tablet by mouth daily at 12 noon.   11/26/2014 at Unknown time  . ranitidine (ZANTAC) 75 MG tablet Take 75 mg by mouth daily as needed for heartburn.   11/26/2014 at Unknown time    Review of Systems  Constitutional: Negative for fever.  Cardiovascular: Positive for leg swelling.  Gastrointestinal: Negative for abdominal pain, constipation, diarrhea, nausea and vomiting.  Genitourinary: Negative for vaginal bleeding and vaginal discharge.  Neurological: Negative for headaches.   Physical Exam   Blood pressure  122/88, pulse 86, resp. rate 16, SpO2 99 %, unknown if currently breastfeeding.  Physical Exam  Nursing note and vitals reviewed. Constitutional: She is oriented to person, place, and time. She appears well-developed and well-nourished. No distress.  HENT:  Head: Normocephalic and atraumatic.  Cardiovascular: Normal rate.   Respiratory: Effort normal.  GI: Soft. She exhibits no distension and no mass. There is no tenderness. There is no rebound and no guarding.  Musculoskeletal: She exhibits no edema.  Neurological: She is alert and oriented to person, place, and time. She has normal reflexes.  No clonus  Skin: Skin is warm and dry. No erythema.  Psychiatric: She has a normal mood and affect.    Results for orders placed or performed during the hospital encounter of 01/30/17 (from the past 24 hour(s))  Urinalysis, Routine w reflex microscopic     Status: Abnormal   Collection Time: 01/30/17  5:59 PM  Result Value Ref Range   Color, Urine YELLOW YELLOW   APPearance CLOUDY (A) CLEAR   Specific Gravity, Urine 1.003 (L) 1.005 - 1.030   pH 7.0 5.0 - 8.0   Glucose, UA NEGATIVE NEGATIVE mg/dL   Hgb urine dipstick NEGATIVE NEGATIVE   Bilirubin Urine NEGATIVE NEGATIVE   Ketones, ur NEGATIVE NEGATIVE mg/dL   Protein, ur NEGATIVE NEGATIVE mg/dL   Nitrite NEGATIVE NEGATIVE   Leukocytes, UA LARGE (A) NEGATIVE  RBC / HPF 0-5 0 - 5 RBC/hpf   WBC, UA 6-30 0 - 5 WBC/hpf   Bacteria, UA FEW (A) NONE SEEN   Squamous Epithelial / LPF 6-30 (A) NONE SEEN  Protein / creatinine ratio, urine     Status: None   Collection Time: 01/30/17  5:59 PM  Result Value Ref Range   Creatinine, Urine 27.00 mg/dL   Total Protein, Urine <6 mg/dL   Protein Creatinine Ratio        0.00 - 0.15 mg/mg[Cre]  CBC     Status: Abnormal   Collection Time: 01/30/17  6:47 PM  Result Value Ref Range   WBC 12.5 (H) 4.0 - 10.5 K/uL   RBC 4.56 3.87 - 5.11 MIL/uL   Hemoglobin 13.9 12.0 - 15.0 g/dL   HCT 16.1 09.6 - 04.5 %    MCV 89.7 78.0 - 100.0 fL   MCH 30.5 26.0 - 34.0 pg   MCHC 34.0 30.0 - 36.0 g/dL   RDW 40.9 81.1 - 91.4 %   Platelets 170 150 - 400 K/uL  Comprehensive metabolic panel     Status: Abnormal   Collection Time: 01/30/17  6:47 PM  Result Value Ref Range   Sodium 133 (L) 135 - 145 mmol/L   Potassium 4.2 3.5 - 5.1 mmol/L   Chloride 103 101 - 111 mmol/L   CO2 23 22 - 32 mmol/L   Glucose, Bld 115 (H) 65 - 99 mg/dL   BUN 8 6 - 20 mg/dL   Creatinine, Ser 7.82 0.44 - 1.00 mg/dL   Calcium 8.8 (L) 8.9 - 10.3 mg/dL   Total Protein 6.6 6.5 - 8.1 g/dL   Albumin 3.1 (L) 3.5 - 5.0 g/dL   AST 25 15 - 41 U/L   ALT 18 14 - 54 U/L   Alkaline Phosphatase 90 38 - 126 U/L   Total Bilirubin 0.5 0.3 - 1.2 mg/dL   GFR calc non Af Amer >60 >60 mL/min   GFR calc Af Amer >60 >60 mL/min   Anion gap 7 5 - 15    Fetal Monitoring: Baseline: 110 bpm Variability: moderate Accelerations: 15 x 15 Decelerations: none Contractions: q 2-6 minutes, mild  Patient Vitals for the past 24 hrs:  BP Pulse Resp SpO2  01/30/17 1930 (!) 128/91 83 - -  01/30/17 1915 122/88 86 - -  01/30/17 1900 133/89 83 - -  01/30/17 1845 134/86 90 - -  01/30/17 1839 (!) 146/94 94 - -  01/30/17 1835 - 84 - 99 %  01/30/17 1830 - - - 99 %  01/30/17 1825 - - - 99 %  01/30/17 1820 - - - 99 %  01/30/17 1815 - - - 100 %  01/30/17 1810 - - - 100 %  01/30/17 1809 - 87 16 99 %  01/30/17 1808 129/86 84 - -    MAU Course  Procedures None  MDM UA, CBC, CMP, Urine protein/creatinine ratio today Serial BPs Discussed patient with Dr. Henderson Cloud. Dr. Henderson Cloud has spoken to the patient on the phone and they have agreed that she may be discharged at this time. She will present to the office tomorrow for a BP check and a decision will be made at that time whether she has GHTN and needs induction.   Assessment and Plan  A: SIUP at [redacted]w[redacted]d Elevated blood pressure in pregnancy, third trimester   P: Discharge home Labor precautions and warning  signs for worsening HTN discussed Patient advised to follow-up with [facility] in the morning for BP check and re-evaluation Patient may return to MAU as needed or if her condition were to change or worsen  Vonzella Nipple, PA-C 01/30/2017, 7:38 PM

## 2017-02-01 ENCOUNTER — Encounter (HOSPITAL_COMMUNITY): Payer: Self-pay

## 2017-02-01 ENCOUNTER — Inpatient Hospital Stay (HOSPITAL_COMMUNITY)
Admission: AD | Admit: 2017-02-01 | Discharge: 2017-02-03 | DRG: 807 | Disposition: A | Discharge status: Home / Self Care | Payer: Managed Care, Other (non HMO) | Source: Ambulatory Visit | Attending: Obstetrics | Admitting: Obstetrics

## 2017-02-01 DIAGNOSIS — O133 Gestational [pregnancy-induced] hypertension without significant proteinuria, third trimester: Secondary | ICD-10-CM | POA: Diagnosis present

## 2017-02-01 DIAGNOSIS — O134 Gestational [pregnancy-induced] hypertension without significant proteinuria, complicating childbirth: Secondary | ICD-10-CM | POA: Diagnosis present

## 2017-02-01 DIAGNOSIS — Z3A39 39 weeks gestation of pregnancy: Secondary | ICD-10-CM | POA: Diagnosis not present

## 2017-02-01 DIAGNOSIS — O99824 Streptococcus B carrier state complicating childbirth: Secondary | ICD-10-CM | POA: Diagnosis present

## 2017-02-01 LAB — CBC
HCT: 41.3 % (ref 36.0–46.0)
Hemoglobin: 14 g/dL (ref 12.0–15.0)
MCH: 30.6 pg (ref 26.0–34.0)
MCHC: 33.9 g/dL (ref 30.0–36.0)
MCV: 90.2 fL (ref 78.0–100.0)
PLATELETS: 165 10*3/uL (ref 150–400)
RBC: 4.58 MIL/uL (ref 3.87–5.11)
RDW: 12.8 % (ref 11.5–15.5)
WBC: 11 10*3/uL — AB (ref 4.0–10.5)

## 2017-02-01 LAB — COMPREHENSIVE METABOLIC PANEL
ALK PHOS: 90 U/L (ref 38–126)
ALT: 16 U/L (ref 14–54)
ANION GAP: 9 (ref 5–15)
AST: 23 U/L (ref 15–41)
Albumin: 3.1 g/dL — ABNORMAL LOW (ref 3.5–5.0)
BILIRUBIN TOTAL: 0.2 mg/dL — AB (ref 0.3–1.2)
BUN: 10 mg/dL (ref 6–20)
CO2: 25 mmol/L (ref 22–32)
Calcium: 9.6 mg/dL (ref 8.9–10.3)
Chloride: 104 mmol/L (ref 101–111)
Creatinine, Ser: 0.58 mg/dL (ref 0.44–1.00)
GFR calc Af Amer: >60 mL/min (ref >60)
GFR calc non Af Amer: >60 mL/min (ref >60)
Glucose, Bld: 102 mg/dL — ABNORMAL HIGH (ref 65–99)
POTASSIUM: 4.3 mmol/L (ref 3.5–5.1)
SODIUM: 138 mmol/L (ref 135–145)
Total Protein: 6.3 g/dL — ABNORMAL LOW (ref 6.5–8.1)

## 2017-02-01 LAB — TYPE AND SCREEN
ABO/RH(D): O POS
Antibody Screen: NEGATIVE

## 2017-02-01 MED ORDER — PENICILLIN G POTASSIUM 5000000 UNITS IJ SOLR
5.0000 10*6.[IU] | Freq: Once | INTRAVENOUS | Status: AC
  Administered 2017-02-01: 5 10*6.[IU] via INTRAVENOUS
  Filled 2017-02-01: qty 5

## 2017-02-01 MED ORDER — LACTATED RINGERS IV SOLN
INTRAVENOUS | Status: DC
  Administered 2017-02-02: 02:00:00 via INTRAVENOUS

## 2017-02-01 MED ORDER — PENICILLIN G POT IN DEXTROSE 60000 UNIT/ML IV SOLN
3.0000 10*6.[IU] | INTRAVENOUS | Status: DC
  Administered 2017-02-01: 3 10*6.[IU] via INTRAVENOUS
  Filled 2017-02-01 (×5): qty 50

## 2017-02-01 MED ORDER — TERBUTALINE SULFATE 1 MG/ML IJ SOLN
0.2500 mg | Freq: Once | INTRAMUSCULAR | Status: DC | PRN
  Filled 2017-02-01: qty 1

## 2017-02-01 MED ORDER — ONDANSETRON HCL 4 MG/2ML IJ SOLN: 4.0000 mg | Freq: Four times a day (QID) | INTRAMUSCULAR | Status: DC | PRN

## 2017-02-01 MED ORDER — OXYTOCIN BOLUS FROM INFUSION
500.0000 mL | Freq: Once | INTRAVENOUS | Status: AC
  Administered 2017-02-02: 500 mL via INTRAVENOUS

## 2017-02-01 MED ORDER — SOD CITRATE-CITRIC ACID 500-334 MG/5ML PO SOLN
30.0000 mL | ORAL | Status: DC | PRN
  Administered 2017-02-02: 30 mL via ORAL
  Filled 2017-02-01: qty 15

## 2017-02-01 MED ORDER — OXYTOCIN 40 UNITS IN LACTATED RINGERS INFUSION - SIMPLE MED: 1.0000 m[IU]/min | INTRAVENOUS | Status: DC

## 2017-02-01 MED ORDER — LIDOCAINE HCL (PF) 1 % IJ SOLN
30.0000 mL | INTRAMUSCULAR | Status: DC | PRN
  Administered 2017-02-02: 30 mL via SUBCUTANEOUS
  Filled 2017-02-01: qty 30

## 2017-02-01 MED ORDER — OXYCODONE-ACETAMINOPHEN 5-325 MG PO TABS: 2.0000 | ORAL_TABLET | ORAL | Status: DC | PRN

## 2017-02-01 MED ORDER — ACETAMINOPHEN 325 MG PO TABS: 650.0000 mg | ORAL_TABLET | ORAL | Status: DC | PRN

## 2017-02-01 MED ORDER — OXYCODONE-ACETAMINOPHEN 5-325 MG PO TABS: 1.0000 | ORAL_TABLET | ORAL | Status: DC | PRN

## 2017-02-01 MED ORDER — LACTATED RINGERS IV SOLN
500.0000 mL | INTRAVENOUS | Status: DC | PRN
  Administered 2017-02-02: 500 mL via INTRAVENOUS

## 2017-02-01 MED ORDER — OXYTOCIN 40 UNITS IN LACTATED RINGERS INFUSION - SIMPLE MED
2.5000 [IU]/h | INTRAVENOUS | Status: DC
  Administered 2017-02-02: 2.5 [IU]/h via INTRAVENOUS
  Filled 2017-02-01: qty 1000

## 2017-02-01 NOTE — H&P (Addendum)
36 y.o. G3P1011 @ [redacted]w[redacted]d presents for IOL for GHTN at term.  She was seen in MAU 2d ago and noted to have BPs 140s/90s with normal labs.  IOL was recommended but she declined as she strongly desired to await natural labor and avoid any interventions / pain control.  She followed up in the office yesterday and diastolic BP was again elevated in the 90s, but she declined IOL.  Today, NST was reactive, but diastolic BP consistently >90.  She agreed to proceed with IOL.  She denies HA/BV/RUQ pain.  Otherwise has good fetal movement and no bleeding.  Pregnancy c/b: 1. Palpitations: referral to cardiology--normal ECHO / holter monitor. Symptoms have lessened and no medical therapy was initiated 2. Rubella non-immune  Past Medical History:  Diagnosis Date  . Medical history non-contributory     Past Surgical History:  Procedure Laterality Date  . NO PAST SURGERIES      OB History  Gravida Para Term Preterm AB Living  SAB TAB Ectopic Multiple Live Births  1     0 1    # Outcome Date GA Lbr Len/2nd Weight Sex Delivery Anes PTL Lv  3 Current           2 Term 11/28/14 [redacted]w[redacted]d 13:36 / 05:55 3.283 kg (7 lb 3.8 oz) F Vag-Spont None  LIV  1 SAB               Social History   Social History  . Marital status: Married    Spouse name: N/A  . Number of children: N/A  . Years of education: N/A   Occupational History  . Film/video editor    Social History Main Topics  . Smoking status: Never Smoker  . Smokeless tobacco: Never Used  . Alcohol use No  . Drug use: No  . Sexual activity: Yes   Other Topics Concern  . Not on file   Social History Narrative  . No narrative on file   Latex    Prenatal Transfer Tool  Maternal Diabetes: No Genetic Screening: Normal NIPT Maternal Ultrasounds/Referrals: Normal Fetal Ultrasounds or other Referrals:  None Maternal Substance Abuse:  No Significant Maternal Medications:  None Significant Maternal Lab Results: Lab values include: Group B  Strep positive  ABO, Rh: --/--/O POS (10/03 1840) Antibody: NEG (10/03 1840) Rubella: Not immune RPR:   NR HBsAg:   Neg HIV:   Neg GBS:   Positive    Other PNC: uncomplicated.    Vitals:   02/01/17 1848 02/01/17 1935  BP: 133/84 (!) 148/94  Pulse: 89 94  Resp: 16 18  Temp: 98.3 F (36.8 C) 98.1 F (36.7 C)     General:  NAD Abdomen:  soft, gravid, EFW 6.5# Ex:  1+ edema bilaterally SVE:  3/50/-2 FHTs:  130s, mod var, + accels, no decels Toco:  q8 min on admission   A/P   36 y.o. [id number] [redacted]w[redacted]d presents with IOL for GHTN at term GHTN--no s/sx severe preeclampsia.  Will monitor BPs closely, check preE labs IOL with pitocin.  FSR/ vtx/ GBS positive--pcn  [facility] [employer]

## 2017-02-02 ENCOUNTER — Encounter (HOSPITAL_COMMUNITY): Payer: Self-pay

## 2017-02-02 LAB — RPR: RPR Ser Ql: NONREACTIVE

## 2017-02-02 MED ORDER — DIPHENHYDRAMINE HCL 25 MG PO CAPS: 25.0000 mg | ORAL_CAPSULE | Freq: Four times a day (QID) | ORAL | Status: DC | PRN

## 2017-02-02 MED ORDER — IBUPROFEN 600 MG PO TABS
600.0000 mg | ORAL_TABLET | Freq: Four times a day (QID) | ORAL | Status: DC
  Administered 2017-02-02 – 2017-02-03 (×6): 600 mg via ORAL
  Filled 2017-02-02 (×5): qty 1

## 2017-02-02 MED ORDER — BENZOCAINE-MENTHOL 20-0.5 % EX AERO
1.0000 "application " | INHALATION_SPRAY | CUTANEOUS | Status: DC | PRN
  Administered 2017-02-02: 1 via TOPICAL
  Filled 2017-02-02: qty 56

## 2017-02-02 MED ORDER — ONDANSETRON HCL 4 MG/2ML IJ SOLN: 4.0000 mg | INTRAMUSCULAR | Status: DC | PRN

## 2017-02-02 MED ORDER — ONDANSETRON HCL 4 MG PO TABS: 4.0000 mg | ORAL_TABLET | ORAL | Status: DC | PRN

## 2017-02-02 MED ORDER — OXYCODONE HCL 5 MG PO TABS: 5.0000 mg | ORAL_TABLET | ORAL | Status: DC | PRN

## 2017-02-02 MED ORDER — SIMETHICONE 80 MG PO CHEW: 80.0000 mg | CHEWABLE_TABLET | ORAL | Status: DC | PRN

## 2017-02-02 MED ORDER — DIBUCAINE 1 % RE OINT: 1.0000 "application " | TOPICAL_OINTMENT | RECTAL | Status: DC | PRN

## 2017-02-02 MED ORDER — ACETAMINOPHEN 325 MG PO TABS: 650.0000 mg | ORAL_TABLET | ORAL | Status: DC | PRN

## 2017-02-02 MED ORDER — TETANUS-DIPHTH-ACELL PERTUSSIS 5-2.5-18.5 LF-MCG/0.5 IM SUSP: 0.5000 mL | Freq: Once | INTRAMUSCULAR | Status: DC

## 2017-02-02 MED ORDER — SENNOSIDES-DOCUSATE SODIUM 8.6-50 MG PO TABS
2.0000 | ORAL_TABLET | ORAL | Status: DC
  Administered 2017-02-02: 2 via ORAL
  Filled 2017-02-02: qty 2

## 2017-02-02 MED ORDER — WITCH HAZEL-GLYCERIN EX PADS: 1.0000 "application " | MEDICATED_PAD | CUTANEOUS | Status: DC | PRN

## 2017-02-02 MED ORDER — OXYCODONE HCL 5 MG PO TABS: 10.0000 mg | ORAL_TABLET | ORAL | Status: DC | PRN

## 2017-02-02 MED ORDER — PRENATAL MULTIVITAMIN CH
1.0000 | ORAL_TABLET | Freq: Every day | ORAL | Status: DC
  Filled 2017-02-02: qty 1

## 2017-02-02 MED ORDER — COCONUT OIL OIL: 1.0000 "application " | TOPICAL_OIL | Status: DC | PRN

## 2017-02-02 NOTE — Progress Notes (Signed)
Breathing through contractions  BP (!) 144/85   Pulse 93   Temp 98.1 F (36.7 C) (Oral)   Resp 18   Ht  (1.727 m)   Wt 81.6 kg (180 lb)   BMI 27.37 kg/m   Toco: q4-5 min EFM: 120s, mod var, + accels, no decels SVE: 4/70/-2, posterior, AROM clear fluid  g3p1011 @ [redacted]w[redacted]d w IOL for GHTN GHTN--bps mild range Cont pitocin IOL--pt has requested multiple times to slow titration of pitocin--on 4mU gbs positive on pcn vtx/fsr

## 2017-02-02 NOTE — Progress Notes (Signed)
Post Partum Day 0 Subjective: no complaints, up ad lib, voiding and tolerating PO  Objective: Blood pressure 136/85, pulse 84, temperature 98.5 F (36.9 C), temperature source Oral, resp. rate 16, height  (1.727 m), weight 81.6 kg (180 lb), unknown if currently breastfeeding.  Physical Exam:  General: alert, cooperative and appears stated age Lochia: appropriate Uterine Fundus: firm Incision: healing well DVT Evaluation: No evidence of DVT seen on physical exam.   Recent Labs  01/30/17 1847 02/01/17 1840  HGB 13.9 14.0  HCT 40.9 41.3    Assessment/Plan: Plan for discharge tomorrow  Breastfeeding BPs normotensive   LOS: 1 day   Christina Tran H. 02/02/2017, 10:01 AM

## 2017-02-03 LAB — CBC
HCT: 35.8 % — ABNORMAL LOW (ref 36.0–46.0)
Hemoglobin: 12.1 g/dL (ref 12.0–15.0)
MCH: 30.5 pg (ref 26.0–34.0)
MCHC: 33.8 g/dL (ref 30.0–36.0)
MCV: 90.2 fL (ref 78.0–100.0)
PLATELETS: 144 10*3/uL — AB (ref 150–400)
RBC: 3.97 MIL/uL (ref 3.87–5.11)
RDW: 13 % (ref 11.5–15.5)
WBC: 11.1 10*3/uL — AB (ref 4.0–10.5)

## 2017-02-03 NOTE — Discharge Summary (Signed)
Obstetric Discharge Summary Reason for Admission: induction of labor Prenatal Procedures: NST and ultrasound Intrapartum Procedures: spontaneous vaginal delivery Postpartum Procedures: none Complications-Operative and Postpartum: 2nd degree perineal laceration Hemoglobin  Date Value Ref Range Status  02/03/2017 12.1 12.0 - 15.0 g/dL Final   HCT  Date Value Ref Range Status  02/03/2017 35.8 (L) 36.0 - 46.0 % Final    Physical Exam:  General: alert and cooperative Lochia: appropriate Uterine Fundus: firm   Discharge Diagnoses: Term Pregnancy-delivered and GTH  Discharge Information: Date: 02/03/2017 Activity: pelvic rest Diet: routine Medications: PNV and Ibuprofen Condition: stable Instructions: refer to practice specific booklet Discharge to: home Follow-up Information    Marlow Baars, MD. Schedule an appointment as soon as possible for a visit in 1 month(s).   Specialty:  Obstetrics Contact information: [address] [place] [address] [phone]           Newborn Data: Live born female  Birth Weight: 7 lb 5.6 oz (3334 g) APGAR: 8, 9  Newborn Delivery   Birth date/time:  02/02/2017 03:08:00 Delivery type:  Vaginal, Spontaneous Delivery      Home with mother.  Nirali Magouirk E 02/03/2017, 1:57 PM

## 2017-02-03 NOTE — Progress Notes (Signed)
PPD#1 Pt states that she is doing well. Lochia wnl VSSAF IMP/ Stable Plan/Routine care 

## 2017-02-03 NOTE — Lactation Note (Addendum)
This note was copied from a baby's chart. Lactation Consultation Note  Patient Name: Christina Tran Date: 02/03/2017 Reason for consult: Initial assessment   With this experienced breast feeding mom and term baby, now 39 hours old and at 7 % weight loss. The mom reports pinching with latch. Mom unlatched the baby with severely pinched nipple, and a blister on her right nipple tip. On exam of baby's mouth, she has an almost anterior , short, thin web from back to front, frenulum. The bay can tick her tongue out over the gum line, but her tongue forms a point, and aims down toward her gum. I fitted mom with a 24 nipple shield, and relatched the baby in football hold. I showed mom and dad how to advance the baby beyond the nipple, for amore comfortable and effective latch. I also gave the parents oral restriction resources, for them to gather information on this topic. Dr. Roma Kayser came in at end of my consult, and was informed of above information. Mom has a dEP at home, and knows to post pump and supplement with her EBm. Milk was seen in the shield after a feeding, and mom does feel letdowns in her breasts with latch. Mom knows to call for questions/conerns, and o/p consult as needed.  1352 - Parents asked how to feed with finger and curved tip syringe, which I did for them. Mom has already called Dr. Lexine Baton and left a message. Mom to pump after breast feeding and supplement baby with EBM  Maternal Data    Feeding Feeding Type: Breast Fed Length of feed: 20 min  LATCH Score Latch: Repeated attempts needed to sustain latch, nipple held in mouth throughout feeding, stimulation needed to elicit sucking reflex. (baby latched deep with 24 nipple shield)  Audible Swallowing: A few with stimulation (transitional milk seen in the shield)  Type of Nipple: Everted at rest and after stimulation  Comfort (Breast/Nipple): Filling, red/small blisters or bruises, mild/mod discomfort (pinched after  feeding , blister on tip of right nipple)  Hold (Positioning): Assistance needed to correctly position infant at breast and maintain latch.  LATCH Score: 6  Interventions Interventions: Breast feeding basics reviewed;Assisted with latch;Skin to skin;Hand express;Support pillows;Position options;Expressed milk;Comfort gels;DEBP  Lactation Tools Discussed/Used Initiated by:: Lily Lovings IBCLC Date initiated:: 02/03/17   Consult Status Consult Status: Complete Follow-up type: Call as needed    Alfred Levins 02/03/2017, 10:38 AM

## 2017-02-03 NOTE — Progress Notes (Signed)
Pt voided and fundus rechecked, was midline

## 2017-06-22 ENCOUNTER — Ambulatory Visit
Admission: RE | Admit: 2017-06-22 | Discharge: 2017-06-22 | Disposition: A | Discharge status: Home / Self Care | Payer: Managed Care, Other (non HMO) | Source: Ambulatory Visit | Attending: Obstetrics | Admitting: Obstetrics

## 2017-06-22 ENCOUNTER — Other Ambulatory Visit: Payer: Self-pay | Admitting: Obstetrics

## 2017-06-22 DIAGNOSIS — N6320 Unspecified lump in the left breast, unspecified quadrant: Secondary | ICD-10-CM

## 2017-06-26 ENCOUNTER — Other Ambulatory Visit: Payer: Managed Care, Other (non HMO)

## 2018-03-01 IMAGING — US ULTRASOUND LEFT BREAST LIMITED
1 series · 4 of 4 positions shown · non-contrast
Comparison: Previous exam(s).

CLINICAL DATA: Breast-feeding female with focal pain in the left
breast.

EXAM:
ULTRASOUND OF THE LEFT BREAST

[Series 1: ultrasound left breast limited · 0.06mm/px · 4 of 4 slices shown]
[im 1/4]
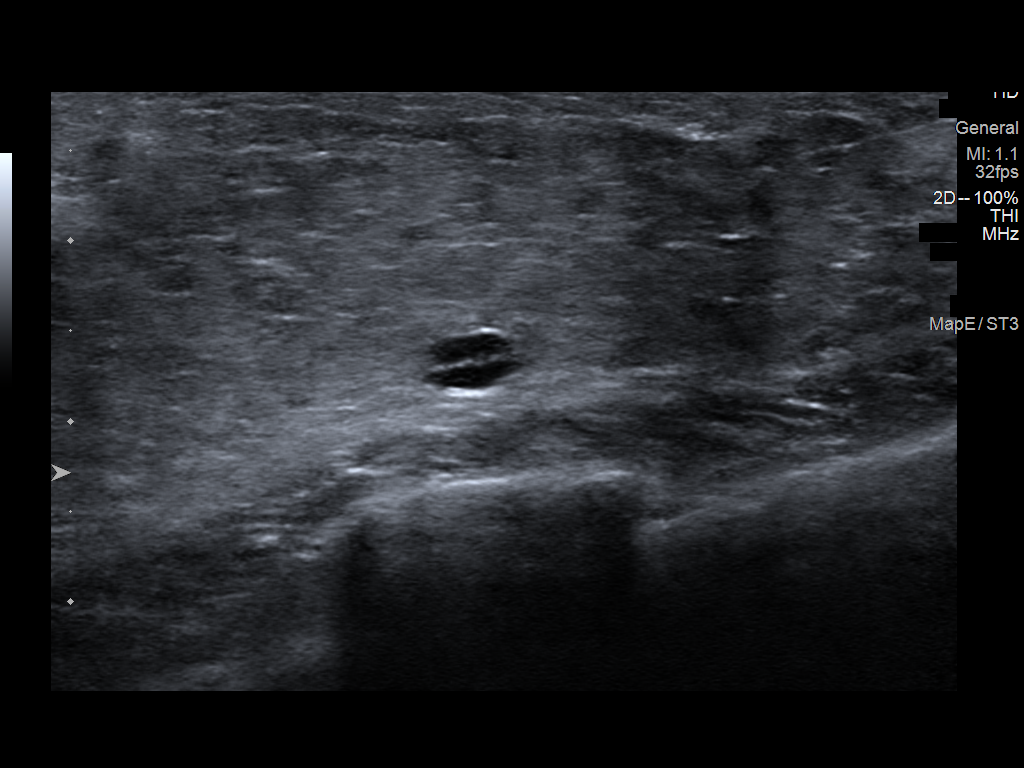
[im 2/4]
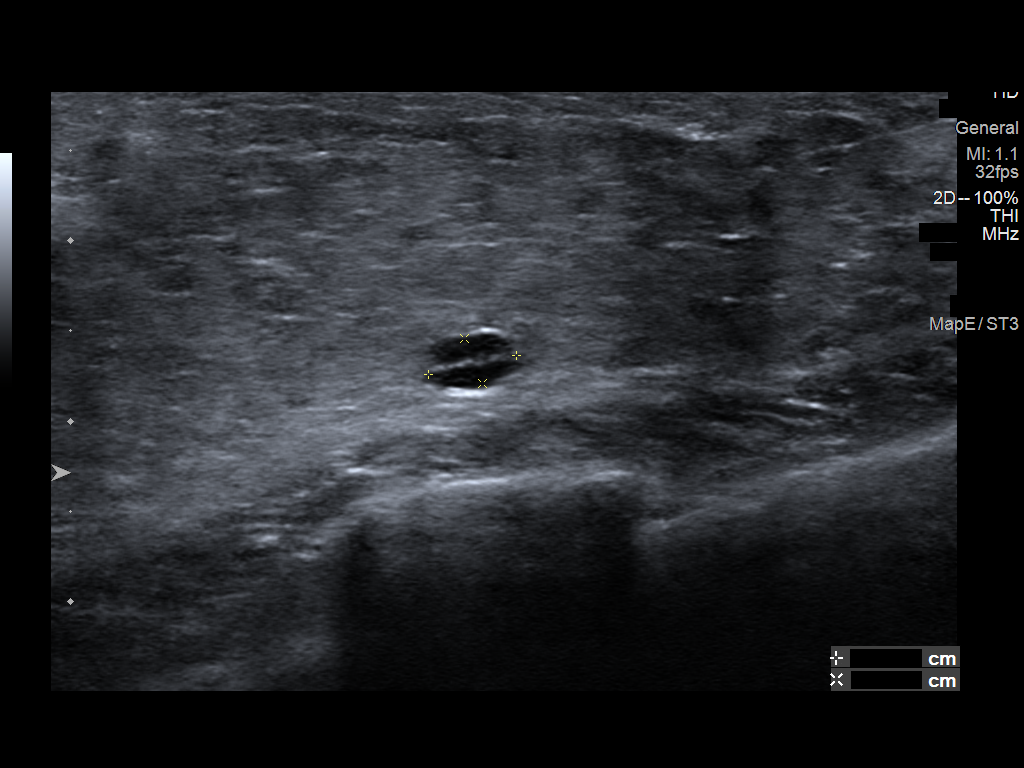
[im 3/4]
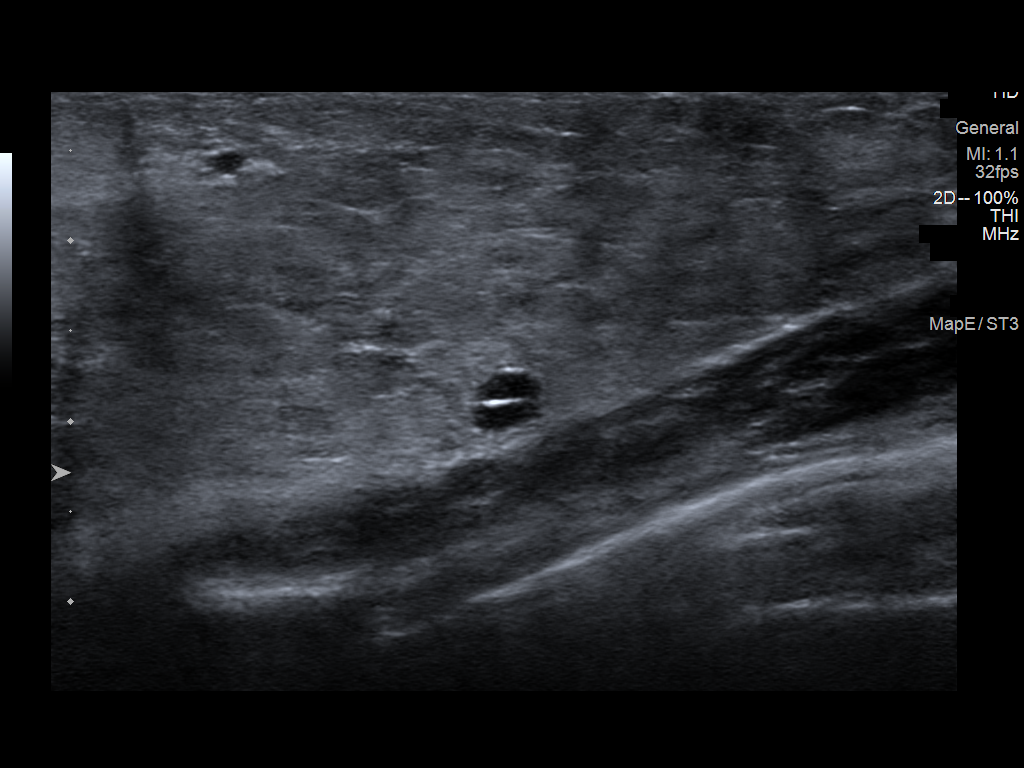
[im 4/4]
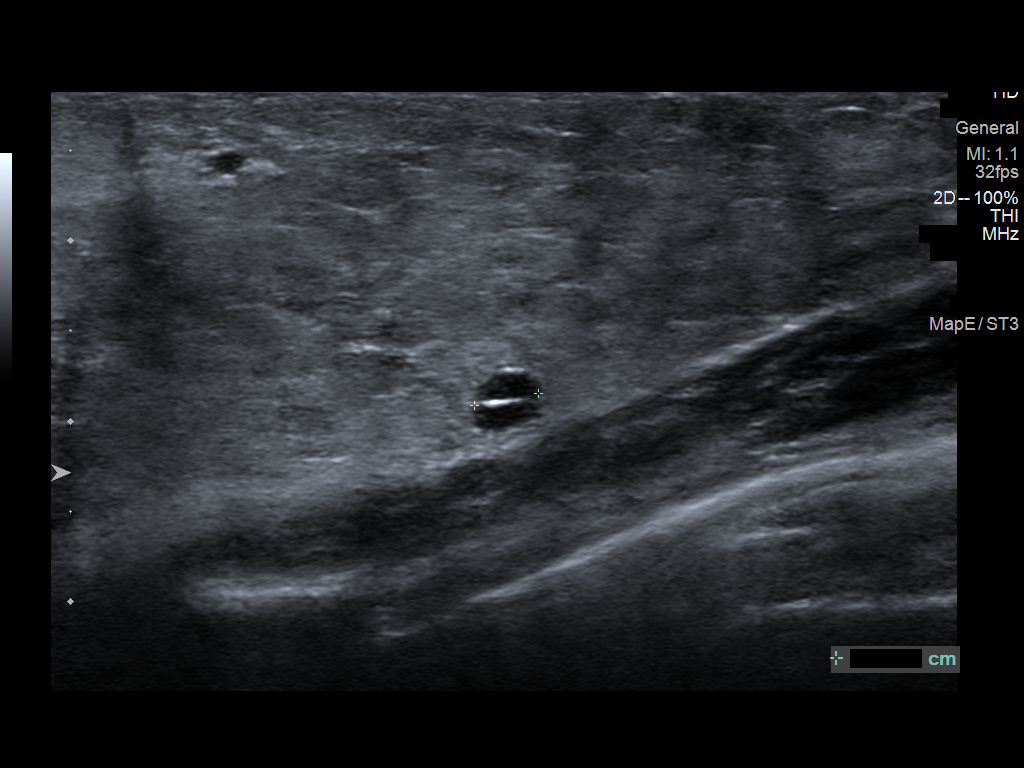

[4 of 4 positions shown; findings below may reference images not displayed]

FINDINGS: On physical exam, no suspicious masses identified.

Targeted ultrasound is performed, showing 2 adjacent cysts or a
single cyst with a single thin septation. No evidence of abscess or
suspicious mass.
IMPRESSION: No sonographic evidence of malignancy.  No evidence of abscess.

RECOMMENDATION:
Recommend treatment of the patient's symptoms based on clinical and
physical exam given the lack of imaging findings. The patient
declined mammography today but may return if her symptoms persist.
She is concerned about early mastitis and will return to her
referring physician for antibiotics.

I have discussed the findings and recommendations with the patient.
Results were also provided in writing at the conclusion of the
visit. If applicable, a reminder letter will be sent to the patient
regarding the next appointment.

BI-RADS CATEGORY  2: Benign.

## 2018-04-05 NOTE — Progress Notes (Signed)
Cardiology Office Note   Date:  04/09/2018   ID:  Christina Tran, DOB 1980-06-10, MRN [id number]  PCP:  Patient, No Pcp Per  Cardiologist:   Rollene RotundaJames Rodina Pinales, MD  Referring:  No ref. provider found  Chief Complaint  Patient presents with  . Palpitations      History of Present Illness: Christina Tran is a 37 y.o. female who is referred by No ref. provider found for evaluation of palpitations.   She is [redacted] weeks pregnant. She had palpitations with her second child who is now 3314 months old.  Her oldest is 553 and 711/37 years old.  With this current pregnancy the patient is feeling palpitations.  This was similar to what she had last time.  Of note these palpitations went away as her pregnancy progressed.  She feels isolated skipped beats.  Feels like he has to catch up.  She has to pause.  She has not had any presyncope or syncope.  She has had some increased shortness of breath with activity such as climbing stairs but is not describing new PND or orthopnea.  She is not have chest pressure, neck or arm discomfort.  During her last pregnancy a Holter monitor did demonstrate infrequent PVCs.  An echocardiogram was unremarkable.  Of note she had a normal delivery although it was early because she had hypertension and she was induced.  She is felt well in between her pregnancies.  Past Medical History:  Diagnosis Date  . Medical history non-contributory   No past history.   Past Surgical History:  Procedure Laterality Date  . NO PAST SURGERIES       Current Outpatient Medications  Medication Sig Dispense Refill  . Prenatal Vit-Fe Fumarate-FA (PRENATAL MULTIVITAMIN) TABS tablet Take 1 tablet by mouth daily at 12 noon.     No current facility-administered medications for this visit.     Allergies:   Latex    ROS:  Please see the history of present illness.   Otherwise, review of systems are positive for none.   All other systems are reviewed and negative.    PHYSICAL EXAM: VS:  BP  102/64   Pulse 75   Ht 5\' 8"  (1.727 m)   Wt 150 lb (68 kg)   BMI 22.81 kg/m  , BMI Body mass index is 22.81 kg/m. GENERAL:  Well appearing NECK:  No jugular venous distention, waveform within normal limits, carotid upstroke brisk and symmetric, no bruits, no thyromegaly LUNGS:  Clear to auscultation bilaterally CHEST:  Unremarkable HEART:  PMI not displaced or sustained,S1 and S2 within normal limits, no S3, no S4, no clicks, no rubs, no murmurs ABD:  Flat, positive bowel sounds normal in frequency in pitch, no bruits, no rebound, no guarding, no midline pulsatile mass, no hepatomegaly, no splenomegaly EXT:  2 plus pulses throughout, no edema, no cyanosis no clubbing   EKG:  EKG is  ordered today. The ekg ordered today demonstrates sinus rhythm, rate 75, axis slightly rightward, intervals within normal limits, borderline right atrial enlargement.   Recent Labs: No results found for requested labs within last 8760 hours.    Lipid Panel No results found for: CHOL, TRIG, HDL, CHOLHDL, VLDL, LDLCALC, LDLDIRECT    Wt Readings from Last 3 Encounters:  04/09/18 150 lb (68 kg)  02/01/17 180 lb (81.6 kg)  08/12/16 158 lb (71.7 kg)      Other studies Reviewed: Additional studies/ records that were reviewed today include:   Echo  and Holter Review of the above records demonstrates:     ASSESSMENT AND PLAN:  PALPITATIONS:   I suspect she is having PVCs again as she had previously.  She had a structurally normal heart.  I will check a TSH level.  For now she wants to defer any therapy such as beta-blockers unless he has become more severe.  She will get an Alive Cor.   DOE:  I will check a BNP level.  However, I suspect a structurally normal heart.  Her exam is otherwise unremarkable.  Current medicines are reviewed at length with the patient today.  The patient does not have concerns regarding medicines.  The following changes have been made:  None  Labs/ tests ordered today  include:    Orders Placed This Encounter  Procedures  . TSH  . Pro b natriuretic peptide (BNP)9[facility] [facility])  . EKG 12-Lead     Disposition:   FU with me in 6 months if palpitations persist.     Signed, Rollene Rotunda, MD  04/09/2018 1:26 PM    [facility]

## 2018-04-09 ENCOUNTER — Encounter: Payer: Self-pay | Admitting: Cardiology

## 2018-04-09 ENCOUNTER — Ambulatory Visit: Payer: Managed Care, Other (non HMO) | Admitting: Cardiology

## 2018-04-09 VITALS — BP 102/64 | HR 75 | Ht 68.0 in | Wt 150.0 lb

## 2018-04-09 DIAGNOSIS — R0609 Other forms of dyspnea: Secondary | ICD-10-CM | POA: Diagnosis not present

## 2018-04-09 DIAGNOSIS — R002 Palpitations: Secondary | ICD-10-CM

## 2018-04-09 NOTE — Patient Instructions (Signed)
Medication Instructions:  Continue current medications  If you need a refill on your cardiac medications before your next appointment, please call your pharmacy.  Labwork: TSH and BNP Today HERE IN OUR OFFICE AT [facility]  Take the provided lab slips with you to the lab for your blood draw.   You will NOT need to fast   If you have labs (blood work) drawn today and your tests are completely normal, you will receive your results only by: Marland Kitchen. MyChart Message (if you have MyChart) OR . A paper copy in the mail If you have any lab test that is abnormal or we need to change your treatment, we will call you to review the results.  Testing/Procedures: None Ordered  Follow-Up: You will need a follow up appointment in 1 Year.  Please call our office 2 months in [phone]) to schedule the (1 Year) appointment.  You may see  DR Antoine PocheHochrein, or one of the following Advanced Practice Providers on your designated Care Team:    . Theodore DemarkRhonda Barrett, PA-C  -OR- Joni ReiningKathryn Lawrence, DNP, ANP  . Corine ShelterLuke Kilroy, [place] JerseyPA-C  . Azalee CourseHao Meng, PA-C -Kathryne Sharper-Angela Duke, PA-C  At [facility], you and your health needs are our priority.  As part of our continuing mission to provide you with exceptional heart care, we have created designated Provider Care Teams.  These Care Teams include your primary Cardiologist (physician) and Advanced Practice Providers (APPs -  Physician Assistants and Nurse Practitioners) who all work together to provide you with the care you need, when you need it.

## 2018-04-10 LAB — PRO B NATRIURETIC PEPTIDE: NT-PRO BNP: 180 pg/mL — AB (ref 0–130)

## 2018-04-10 LAB — TSH: TSH: 1.17 u[IU]/mL (ref 0.450–4.500)

## 2018-04-13 ENCOUNTER — Telehealth: Payer: Self-pay | Admitting: Cardiology

## 2018-04-13 DIAGNOSIS — R002 Palpitations: Secondary | ICD-10-CM

## 2018-04-13 NOTE — Telephone Encounter (Signed)
Patient would like lab results.

## 2018-04-13 NOTE — Telephone Encounter (Signed)
Called patient, advised that we did not have results back yet. Will send to Dr.Hochrein and nurse to review.  Thanks!

## 2018-04-13 NOTE — Telephone Encounter (Signed)
Echo ordered and send to scheduler to be schedule 

## 2018-04-13 NOTE — Telephone Encounter (Signed)
Called with elevated BNP.  She needs an echocardiogram.  Please call to schedule.

## 2018-04-30 ENCOUNTER — Ambulatory Visit (HOSPITAL_COMMUNITY): Payer: Managed Care, Other (non HMO)

## 2018-05-11 ENCOUNTER — Other Ambulatory Visit: Payer: Self-pay

## 2018-05-11 ENCOUNTER — Ambulatory Visit (HOSPITAL_COMMUNITY): Payer: Managed Care, Other (non HMO) | Attending: Cardiovascular Disease

## 2018-05-11 DIAGNOSIS — R002 Palpitations: Secondary | ICD-10-CM | POA: Insufficient documentation

## 2018-10-26 ENCOUNTER — Inpatient Hospital Stay (HOSPITAL_COMMUNITY): Admission: RE | Admit: 2018-10-26 | Payer: Managed Care, Other (non HMO) | Source: Home / Self Care

## 2019-07-09 ENCOUNTER — Encounter: Payer: Self-pay | Admitting: General Practice

## 2020-01-31 ENCOUNTER — Other Ambulatory Visit: Payer: Self-pay | Admitting: Radiology

## 2020-01-31 DIAGNOSIS — N6452 Nipple discharge: Secondary | ICD-10-CM

## 2020-02-11 ENCOUNTER — Other Ambulatory Visit: Payer: Managed Care, Other (non HMO)

## 2020-04-08 ENCOUNTER — Other Ambulatory Visit: Payer: Self-pay

## 2020-04-08 ENCOUNTER — Ambulatory Visit
Admission: RE | Admit: 2020-04-08 | Discharge: 2020-04-08 | Disposition: A | Discharge status: Home / Self Care | Payer: Managed Care, Other (non HMO) | Source: Ambulatory Visit | Attending: Radiology | Admitting: Radiology

## 2020-04-08 DIAGNOSIS — N6452 Nipple discharge: Secondary | ICD-10-CM

## 2020-04-08 MED ORDER — GADOBUTROL 1 MMOL/ML IV SOLN
7.0000 mL | Freq: Once | INTRAVENOUS | Status: AC | PRN
  Administered 2020-04-08: 7 mL via INTRAVENOUS

## 2020-12-16 IMAGING — MR MR BREAST BILAT WO/W CM
8 of 12 series · 32 of 48 positions shown · IV contrast (gadavist)
Comparison: No prior MRI available for comparison. Correlation made
with prior mammogram and ultrasound images.

CLINICAL DATA: 39-year-old female with provided clinical history
indicating clear and bloody nipple discharge since [REDACTED].
Clinical information provided does not indicate which breast is
having symptoms. She has family history of breast cancer in her
mother at the age of 60. She presented to [facility] [REDACTED] in at
the end January 2020 for evaluation of focal pain in the left
breast. The patient was breast feeding at the time. Benign cysts
were identified in the retroareolar left breast.

LABS:  None.
EXAM:
BILATERAL BREAST MRI WITH AND WITHOUT CONTRAST
TECHNIQUE: Multiplanar, multisequence MR images of both breasts were obtained
prior to and following the intravenous administration of 7 ml of
Gadavist

[Series 2: t2_tirm_tra ipat (a-p) · axial · 3.0mm · 0.66mm/px · 1 of 55 slices shown]
[im 1/55]
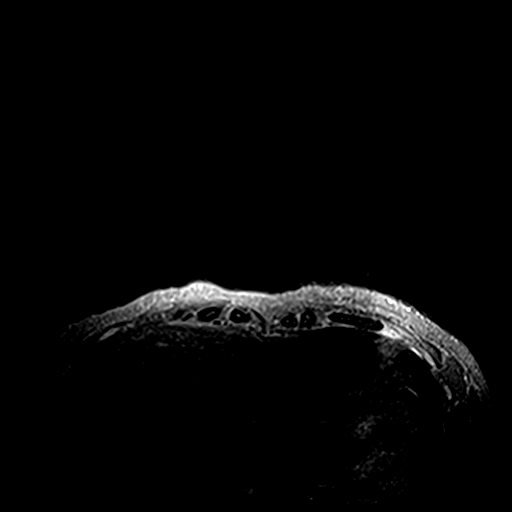

[Series 3: fl3d pre-cm no · axial · non-contrast · 1.2mm · 0.89mm/px · z∈[-74,+98]mm · 5 of 144 slices shown]
[im 1/144]
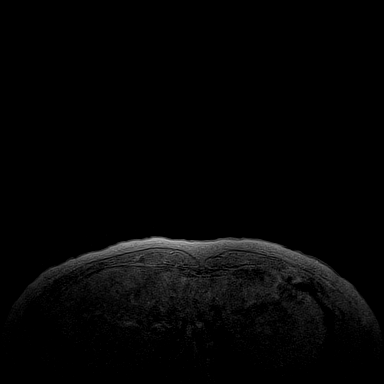
[im 36/144]
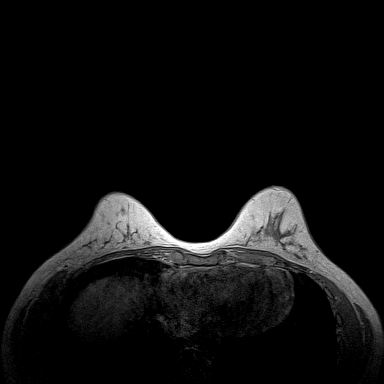
[im 72/144]
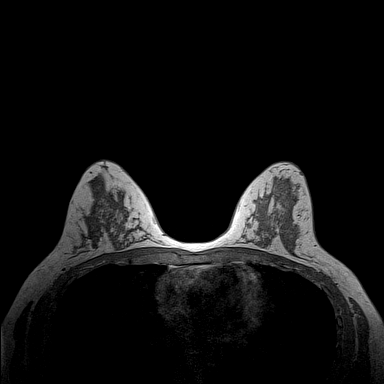
[im 108/144]
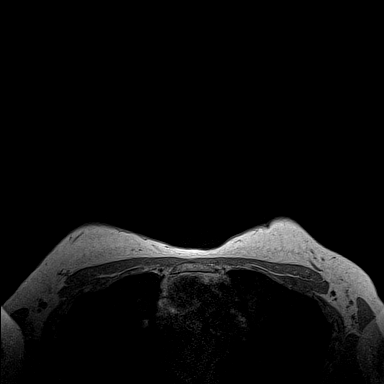
[im 144/144]
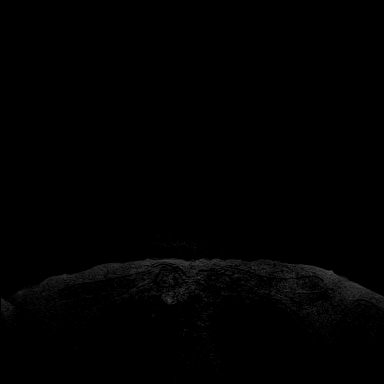

[Series 4: fl3d pre-cm · axial · non-contrast · 1.2mm · 0.89mm/px · z∈[-74,+98]mm · 5 of 144 slices shown]
[im 1/144]
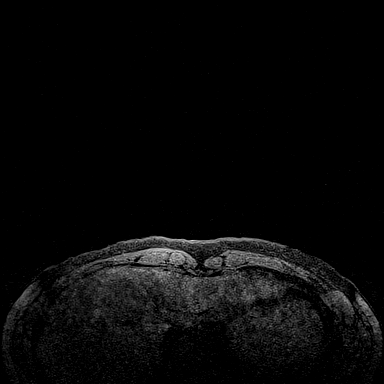
[im 36/144]
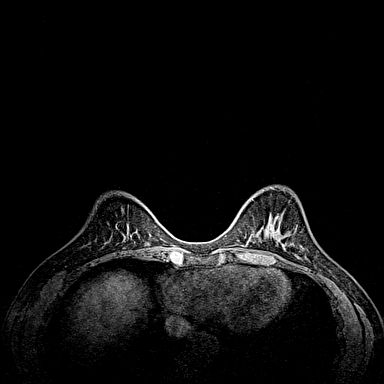
[im 72/144]
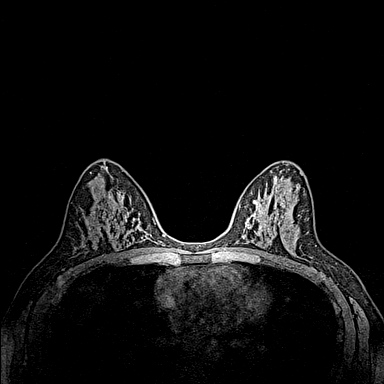
[im 108/144]
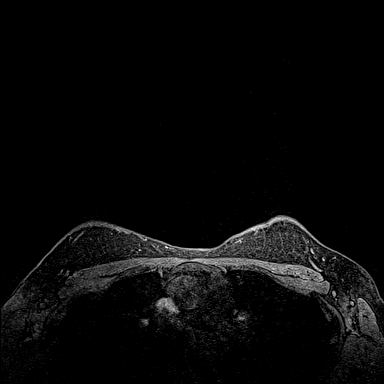
[im 144/144]
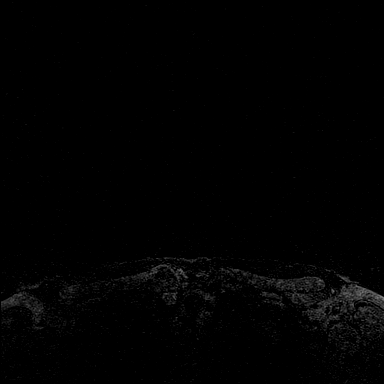

[Series 5: fl3d post-cm 20 · axial · 1.2mm · 0.89mm/px · z∈[-74,+98]mm · 5 of 144 slices shown (1 of 3)]
[im 1/144]
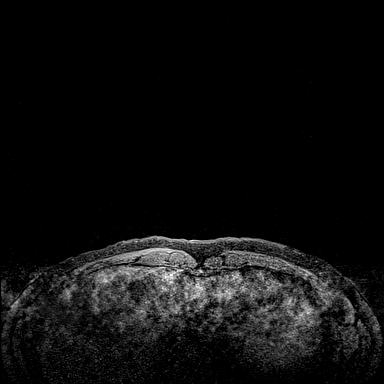
[im 36/144]
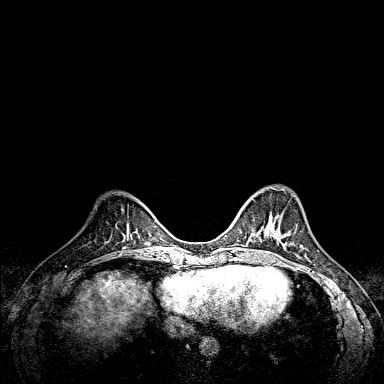
[im 72/144]
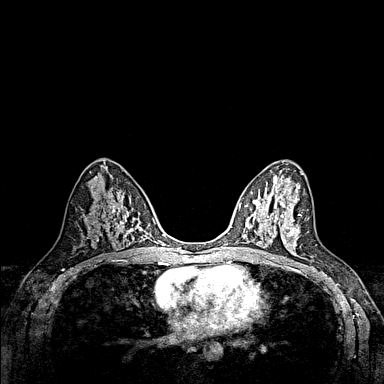
[im 108/144]
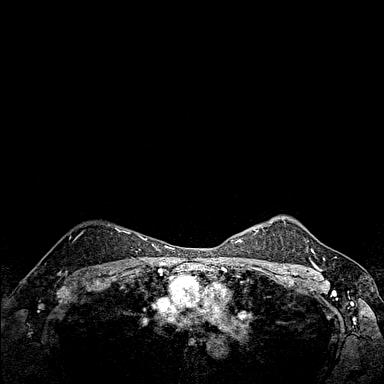
[im 144/144]
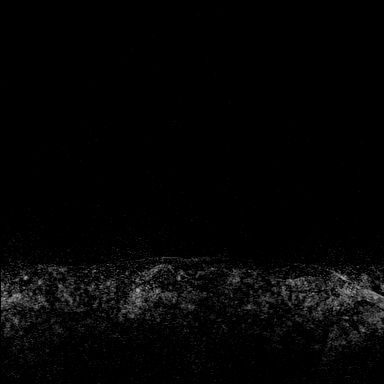

[Series 6: fl3d post-cm 20 · axial · 1.2mm · 0.89mm/px · z∈[-74,+98]mm · 5 of 144 slices shown (2 of 3)]
[im 1/144]
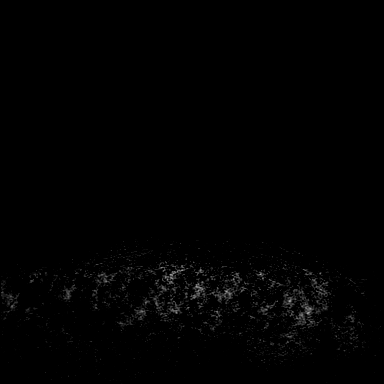
[im 36/144]
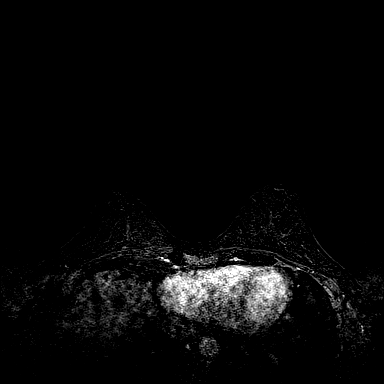
[im 72/144]
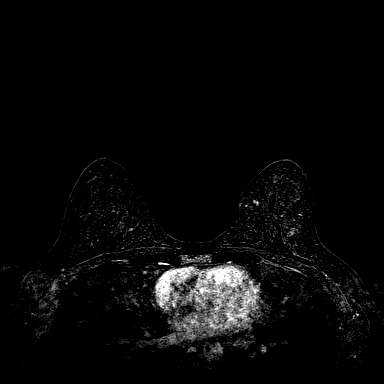
[im 108/144]
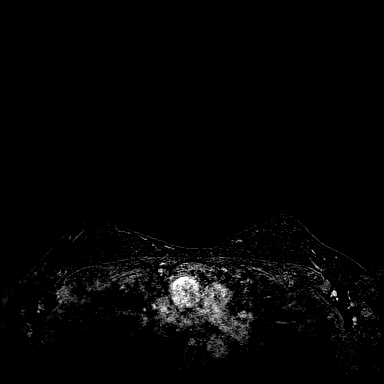
[im 144/144]
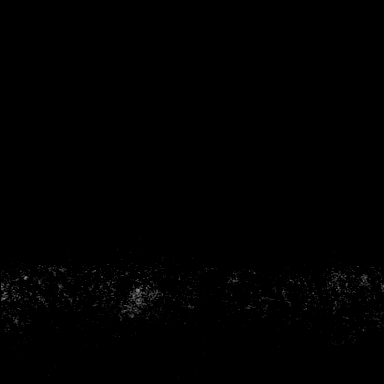

[Series 7: fl3d post-cm 20 · axial · 172.8mm · 0.89mm/px · 1 of 1 slices shown (3 of 3)]
[im 1/1]
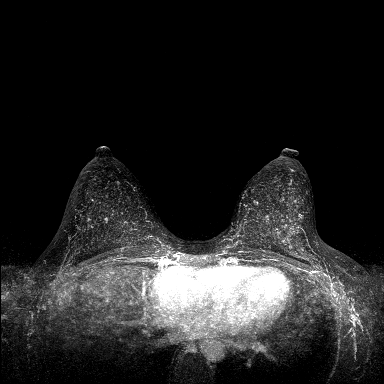

[Series 8: fl3d post-cm 3min · axial · 1.2mm · 0.89mm/px · z∈[-74,+98]mm · 6 of 144 slices shown]
[im 1/144]
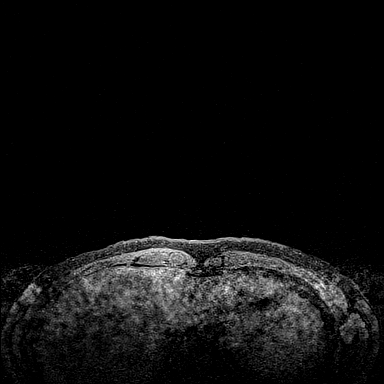
[im 29/144]
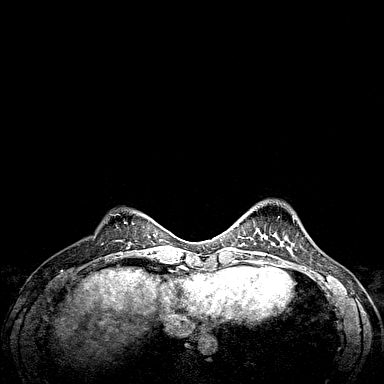
[im 58/144]
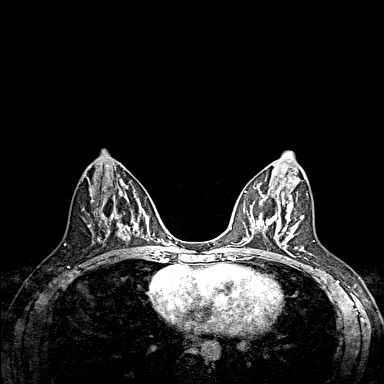
[im 86/144]
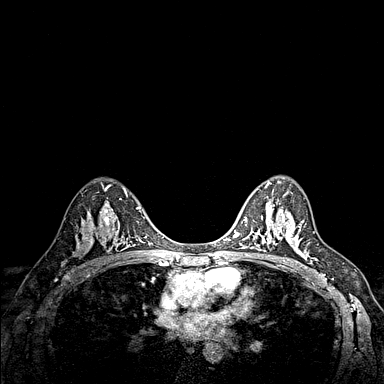
[im 115/144]
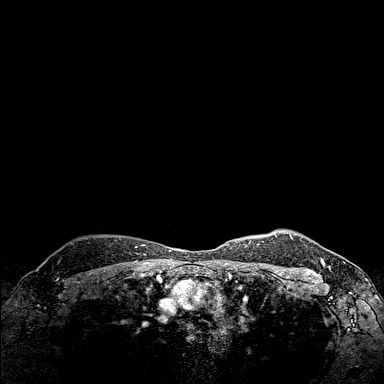
[im 144/144]
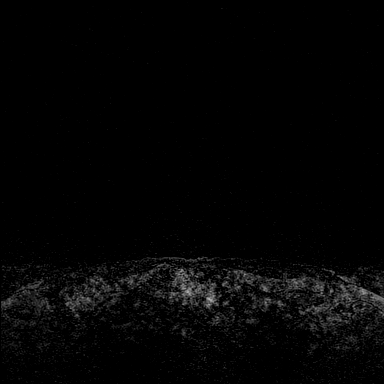

[Series 9: fl3d post-cm 3min_sub · axial · 1.2mm · 0.89mm/px · z∈[-74,+28]mm · 4 of 144 slices shown]
[im 1/144]
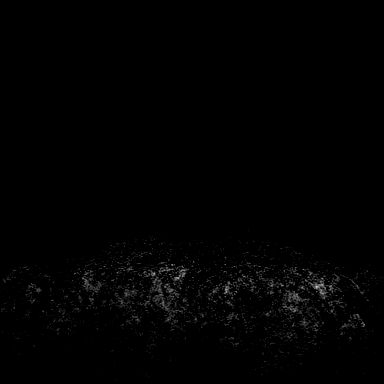
[im 29/144]
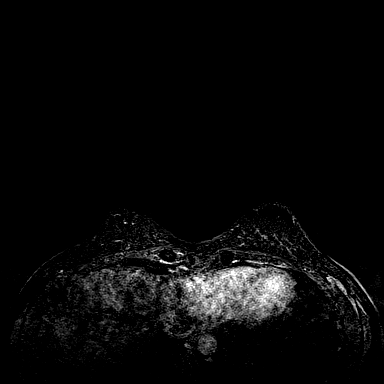
[im 58/144]
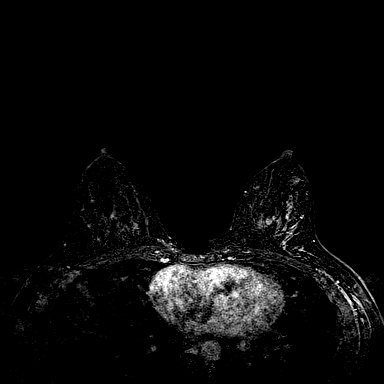
[im 86/144]
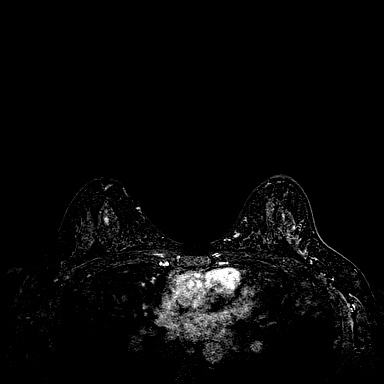

[32 of 48 positions shown; findings below may reference images not displayed]

Three-dimensional MR images were rendered by post-processing of the
original MR data on an independent workstation. The
three-dimensional MR images were interpreted, and findings are
reported in the following complete MRI report for this study. Three
dimensional images were evaluated at the independent interpreting
workstation using the DynaCAD thin client.
FINDINGS: Breast composition: c. Heterogeneous fibroglandular tissue.

Background parenchymal enhancement: Moderate.

Right breast: No mass or abnormal enhancement.

Left breast: No mass or abnormal enhancement.

Lymph nodes: No abnormal appearing lymph nodes.

Ancillary findings:  None.
IMPRESSION: 1. There are no findings in either breast to explain the patient's
clear and bloody nipple discharge (the laterality of the symptomatic
breast is unknown at the time of this dictation).

2.  No MRI evidence of malignancy in either breast.

RECOMMENDATION:
1. If the nipple discharge is in the left breast, and she was
symptomatic at the time of her diagnostic mammogram 01/30/2020, then
surgical consultation is recommended.

2. If the nipple discharge is in the right breast, or if she was not
symptomatic with nipple discharge of the left breast at the time of
her evaluation 01/30/2020, than diagnostic mammography and
ultrasound is recommended as well as surgical consultation.

BI-RADS CATEGORY  1: Negative.
